# Patient Record
Sex: Female | Born: 2000 | Race: White | Hispanic: No | Marital: Single | Smoking: Never smoker
Health system: Southern US, Community
[De-identification: ages and names within clinical notes are randomized; demographics above are authoritative.]

## PROBLEM LIST (undated history)

## (undated) DIAGNOSIS — F419 Anxiety disorder, unspecified: Secondary | ICD-10-CM

## (undated) DIAGNOSIS — F32A Depression, unspecified: Secondary | ICD-10-CM

## (undated) DIAGNOSIS — F909 Attention-deficit hyperactivity disorder, unspecified type: Secondary | ICD-10-CM

## (undated) DIAGNOSIS — F329 Major depressive disorder, single episode, unspecified: Secondary | ICD-10-CM

## (undated) DIAGNOSIS — F913 Oppositional defiant disorder: Secondary | ICD-10-CM

## (undated) HISTORY — DX: Oppositional defiant disorder: F91.3

## (undated) HISTORY — DX: Attention-deficit hyperactivity disorder, unspecified type: F90.9

---
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
## 2020-04-18 ENCOUNTER — Encounter: Payer: Self-pay | Admitting: Emergency Medicine

## 2020-04-18 ENCOUNTER — Ambulatory Visit
Admission: EM | Admit: 2020-04-18 | Discharge: 2020-04-18 | Disposition: A | Discharge status: Home / Self Care | Payer: Medicaid Other | Attending: Family Medicine | Admitting: Family Medicine

## 2020-04-18 ENCOUNTER — Other Ambulatory Visit: Payer: Self-pay

## 2020-04-18 DIAGNOSIS — N946 Dysmenorrhea, unspecified: Secondary | ICD-10-CM | POA: Diagnosis not present

## 2020-04-18 MED ORDER — ONDANSETRON 4 MG PO TBDP: 4.0000 mg | ORAL_TABLET | Freq: Three times a day (TID) | ORAL | 0 refills | Status: DC | PRN

## 2020-04-18 MED ORDER — NAPROXEN 500 MG PO TABS: 500.0000 mg | ORAL_TABLET | Freq: Two times a day (BID) | ORAL | 0 refills | Status: DC

## 2020-04-18 NOTE — ED Triage Notes (Signed)
Reports menstrual cramping more painful today.  Lower back, abdominal and headache pain.  Periods are not usually this painful

## 2020-04-20 NOTE — ED Provider Notes (Signed)
[facility]   [id number] 04/18/20 Arrival Time: 1548  ASSESSMENT & PLAN:  1. Severe menstrual cramps     Benign abdominal exam. No indications for urgent abdominal/pelvic imaging at this time. Discussed.  Meds ordered this encounter  Medications  . naproxen (NAPROSYN) 500 MG tablet    Sig: Take 1 tablet (500 mg total) by mouth 2 (two) times daily with a meal.    Dispense:  20 tablet    Refill:  0  . ondansetron (ZOFRAN-ODT) 4 MG disintegrating tablet    Sig: Take 1 tablet (4 mg total) by mouth every 8 (eight) hours as needed for nausea or vomiting.    Dispense:  15 tablet    Refill:  0    Follow-up Information    Meccariello, Solmon Ice, DO.   Specialty: Family Medicine Why: As needed. Contact information: [address] [place] [address] [phone]               Reviewed expectations re: course of current medical issues. Questions answered. Outlined signs and symptoms indicating need for more acute intervention. Patient verbalized understanding. After Visit Summary given.   SUBJECTIVE: History from: patient. Michaela Jennings is a 20 y.o. female who presents with complaint of "intense period cramping"; abrupt onset yesterday; same today. Regular menstrual periods "but usually not this painful". Afebrile. No OTC tx. Normal PO intake without n/v/d. Patient's last menstrual period was 04/18/2020.  History reviewed. No pertinent surgical history.   OBJECTIVE:  Vitals:   04/18/20 1557  BP: 115/81  Pulse: 72  Resp: 18  Temp: 97.9 F (36.6 C)  TempSrc: Oral  SpO2: 99%    General appearance: alert, oriented, no acute distress HEENT: [place]; AT; oropharynx moist Lungs: unlabored respirations Abdomen: soft; without distention; mild  and poorly localized tenderness to palpation over lower abdomen; mid; normal bowel sounds; without masses or organomegaly; without guarding or rebound tenderness Back: without reported CVA tenderness; FROM at  waist Extremities: without LE edema; symmetrical; without gross deformities Skin: warm and dry Neurologic: normal gait Psychological: alert and cooperative; normal mood and affect    No Known Allergies                                             Past Medical History:  Diagnosis Date  . Anxiety   . Depression     Social History   Socioeconomic History  . Marital status: Single    Spouse name: Not on file  . Number of children: Not on file  . Years of education: Not on file  . Highest education level: Not on file  Occupational History  . Not on file  Tobacco Use  . Smoking status: Never Smoker  . Smokeless tobacco: Current User  Vaping Use  . Vaping Use: Every day  Substance and Sexual Activity  . Alcohol use: Yes  . Drug use: Yes    Types: Marijuana  . Sexual activity: Not Currently    Birth control/protection: None    Comment: not sexually active x 1 month  Other Topics Concern  . Not on file  Social History Narrative   Lives with mother.  Father and her mother recently separated.  Mother is pregnant with baby girl, due August 16.   Social Determinants of Health   Financial Resource Strain: Not on file  Food Insecurity: Not on file  Transportation Needs: Not on file  Physical Activity: Not on file  Stress: Not on file  Social Connections: Not on file  Intimate Partner Violence: Not on file    Family History  Problem Relation Age of Onset  . Asthma Mother   . Anxiety disorder Mother   . Depression Mother      [name], MD 04/20/20 [id number]

## 2020-04-30 ENCOUNTER — Other Ambulatory Visit: Payer: Self-pay

## 2020-04-30 ENCOUNTER — Ambulatory Visit
Admission: EM | Admit: 2020-04-30 | Discharge: 2020-04-30 | Disposition: A | Discharge status: Home / Self Care | Payer: Medicaid Other | Attending: Emergency Medicine | Admitting: Emergency Medicine

## 2020-04-30 DIAGNOSIS — J069 Acute upper respiratory infection, unspecified: Secondary | ICD-10-CM

## 2020-04-30 DIAGNOSIS — M545 Low back pain, unspecified: Secondary | ICD-10-CM

## 2020-04-30 MED ORDER — IBUPROFEN 800 MG PO TABS: 800.0000 mg | ORAL_TABLET | Freq: Three times a day (TID) | ORAL | 0 refills | Status: DC

## 2020-04-30 MED ORDER — FLUTICASONE PROPIONATE 50 MCG/ACT NA SUSP
1.0000 | Freq: Every day | NASAL | 0 refills | Status: DC
Start: 2020-04-30 — End: 2021-02-01

## 2020-04-30 MED ORDER — CETIRIZINE HCL 10 MG PO CAPS: 10.0000 mg | ORAL_CAPSULE | Freq: Every day | ORAL | 0 refills | Status: DC

## 2020-04-30 NOTE — ED Triage Notes (Signed)
Pt presents with runny nose and scratchy throat that began yesterday, also states while on the way here she developed sharp right side back pain

## 2020-04-30 NOTE — ED Provider Notes (Signed)
[facility]    CSN: [id number] Arrival date & time: 04/30/20  1111      History   Chief Complaint Chief Complaint  Patient presents with  . Nasal Congestion  . Sore Throat  . Back Pain    HPI Michaela Jennings is a 20 y.o. female presenting today for evaluation of URI symptoms.  Reports sinus congestion rhinorrhea and drainage with throat irritation beginning yesterday.  Has felt slightly warm, but denies any known fevers.  On the way to the urgent care she developed a discomfort in her right lower back which is since improved.  Denies urinary symptoms.  HPI  Past Medical History:  Diagnosis Date  . Anxiety   . Depression     Patient Active Problem List   Diagnosis Date Noted  . Depression with anxiety 05/08/2018  . Encounter for initial prescription of contraceptive pills 01/02/2017  . Rash and nonspecific skin eruption 06/22/2013  . Oppositional defiant disorder 05/22/2010  . ATTENTION DEFICIT DISORDER 12/18/2009  . DISRUPTIVE BEHAVIOR DISORDER 01/15/2009  . REFRACTIVE AMBLYOPIA 07/26/2007    History reviewed. No pertinent surgical history.  OB History   No obstetric history on file.      Home Medications    Prior to Admission medications   Medication Sig Start Date End Date Taking? Authorizing Provider  Cetirizine HCl 10 MG CAPS Take 1 capsule (10 mg total) by mouth daily for 10 days. 04/30/20 05/10/20 Yes Zalyn Amend C, PA-C  fluticasone (FLONASE) 50 MCG/ACT nasal spray Place 1-2 sprays into both nostrils daily. 04/30/20  Yes Kamyah Wilhelmsen C, PA-C  ibuprofen (ADVIL) 800 MG tablet Take 1 tablet (800 mg total) by mouth 3 (three) times daily. 04/30/20  Yes Kentrell Guettler C, PA-C  ondansetron (ZOFRAN-ODT) 4 MG disintegrating tablet Take 1 tablet (4 mg total) by mouth every 8 (eight) hours as needed for nausea or vomiting. 04/18/20   [name], MD  levocetirizine (XYZAL) 5 MG tablet Take 1 tablet (5 mg total) by mouth every evening. Patient not  taking: Reported on 03/17/2019 12/16/18 04/18/20  Wallis Bamberg, PA-C  sertraline (ZOLOFT) 25 MG tablet Take 3 tablets (75 mg total) by mouth daily. Patient not taking: Reported on 03/17/2019 07/09/18 04/18/20  Shon Hale, MD  SPRINTEC 28 0.25-35 MG-MCG tablet TAKE 1 TABLET BY MOUTH EVERY DAY Patient not taking: No sig reported 03/05/18 04/18/20  Howard Pouch, MD    Family History Family History  Problem Relation Age of Onset  . Asthma Mother   . Anxiety disorder Mother   . Depression Mother     Social History Social History   Tobacco Use  . Smoking status: Never Smoker  . Smokeless tobacco: Current User  Vaping Use  . Vaping Use: Every day  Substance Use Topics  . Alcohol use: Yes  . Drug use: Yes    Types: Marijuana     Allergies   Patient has no known allergies.   Review of Systems Review of Systems  Constitutional: Negative for activity change, appetite change, chills, fatigue and fever.  HENT: Positive for congestion, rhinorrhea, sinus pressure and sore throat. Negative for ear pain and trouble swallowing.   Eyes: Negative for discharge and redness.  Respiratory: Negative for cough, chest tightness and shortness of breath.   Cardiovascular: Negative for chest pain.  Gastrointestinal: Negative for abdominal pain, diarrhea, nausea and vomiting.  Musculoskeletal: Positive for back pain and myalgias.  Skin: Negative for rash.  Neurological: Negative for dizziness, light-headedness and headaches.  Physical Exam Triage Vital Signs ED Triage Vitals  Enc Vitals Group     BP 04/30/20 1214 111/80     Pulse Rate 04/30/20 1214 74     Resp 04/30/20 1214 18     Temp 04/30/20 1214 98 F (36.7 C)     Temp src --      SpO2 04/30/20 1214 99 %     Weight --      Height --      Head Circumference --      Peak Flow --      Pain Score 04/30/20 1212 7     Pain Loc --      Pain Edu? --      Excl. in GC? --    No data found.  Updated Vital Signs BP 111/80   Pulse  74   Temp 98 F (36.7 C)   Resp 18   LMP 04/18/2020   SpO2 99%   Visual Acuity Right Eye Distance:   Left Eye Distance:   Bilateral Distance:    Right Eye Near:   Left Eye Near:    Bilateral Near:     Physical Exam Vitals and nursing note reviewed.  Constitutional:      Appearance: She is well-developed.     Comments: No acute distress  HENT:     Head: Normocephalic and atraumatic.     Ears:     Comments: Bilateral ears without tenderness to palpation of external auricle, tragus and mastoid, EAC's without erythema or swelling, TM's with good bony landmarks and cone of light. Non erythematous.     Nose: Nose normal.     Mouth/Throat:     Comments: Oral mucosa pink and moist, no tonsillar enlargement or exudate. Posterior pharynx patent and nonerythematous, no uvula deviation or swelling. Normal phonation. Eyes:     Conjunctiva/sclera: Conjunctivae normal.  Cardiovascular:     Rate and Rhythm: Normal rate.  Pulmonary:     Effort: Pulmonary effort is normal. No respiratory distress.     Comments: Breathing comfortably at rest, CTABL, no wheezing, rales or other adventitious sounds auscultated Abdominal:     General: There is no distension.  Musculoskeletal:        General: Normal range of motion.     Cervical back: Neck supple.  Skin:    General: Skin is warm and dry.  Neurological:     Mental Status: She is alert and oriented to person, place, and time.      UC Treatments / Results  Labs (all labs ordered are listed, but only abnormal results are displayed) Labs Reviewed  NOVEL CORONAVIRUS, NAA    EKG   Radiology No results found.  Procedures Procedures (including critical care time)  Medications Ordered in UC Medications - No data to display  Initial Impression / Assessment and Plan / UC Course  I have reviewed the triage vital signs and the nursing notes.  Pertinent labs & imaging results that were available during my care of the patient were  reviewed by me and considered in my medical decision making (see chart for details).     Viral URI with cough versus allergic rhinitis -Covid test pending, exam reassuring, lungs clear to auscultation, suspect viral etiology and recommend symptomatic and supportive care at this time.  Recommendations provided.  Rest and fluids.  Continue to monitor.  Back pain-brief episode of pain, likely MSK etiology, low suspicion of underlying kidney/UTI problem.  Treating with anti-inflammatories and monitor.  Discussed  strict return precautions. Patient verbalized understanding and is agreeable with plan.   Final Clinical Impressions(s) / UC Diagnoses   Final diagnoses:  Viral URI with cough  Acute right-sided low back pain without sciatica     Discharge Instructions     Zyrtec and Flonase daily for congestion and drainage If still feeling congested may supplement over-the-counter Mucinex or Sudafed Tylenol and ibuprofen for throat pain, back pain Follow-up if not improving or worsening    ED Prescriptions    Medication Sig Dispense Auth. Provider   ibuprofen (ADVIL) 800 MG tablet Take 1 tablet (800 mg total) by mouth 3 (three) times daily. 21 tablet Baruc Tugwell C, PA-C   fluticasone (FLONASE) 50 MCG/ACT nasal spray Place 1-2 sprays into both nostrils daily. 16 g Dotti Busey C, PA-C   Cetirizine HCl 10 MG CAPS Take 1 capsule (10 mg total) by mouth daily for 10 days. 10 capsule Chayil Gantt, [place] C, PA-C     PDMP not reviewed this encounter.   Lew Dawes, [place] 04/30/20 1258

## 2020-04-30 NOTE — Discharge Instructions (Signed)
Zyrtec and Flonase daily for congestion and drainage If still feeling congested may supplement over-the-counter Mucinex or Sudafed Tylenol and ibuprofen for throat pain, back pain Follow-up if not improving or worsening

## 2020-05-01 LAB — NOVEL CORONAVIRUS, NAA: SARS-CoV-2, NAA: NOT DETECTED

## 2020-05-01 LAB — SARS-COV-2, NAA 2 DAY TAT

## 2020-07-21 DIAGNOSIS — F419 Anxiety disorder, unspecified: Secondary | ICD-10-CM | POA: Diagnosis not present
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
## 2020-08-07 DIAGNOSIS — F419 Anxiety disorder, unspecified: Secondary | ICD-10-CM | POA: Diagnosis not present

## 2020-08-24 ENCOUNTER — Ambulatory Visit: Payer: Medicaid Other

## 2020-08-31 ENCOUNTER — Encounter: Payer: Self-pay | Admitting: Family Medicine

## 2020-08-31 ENCOUNTER — Ambulatory Visit (INDEPENDENT_AMBULATORY_CARE_PROVIDER_SITE_OTHER): Payer: Medicaid Other | Admitting: Family Medicine

## 2020-08-31 ENCOUNTER — Other Ambulatory Visit: Payer: Self-pay

## 2020-08-31 VITALS — BP 115/80 | HR 62 | Ht 65.0 in | Wt 140.0 lb

## 2020-08-31 DIAGNOSIS — F418 Other specified anxiety disorders: Secondary | ICD-10-CM

## 2020-08-31 NOTE — Assessment & Plan Note (Signed)
-  PHQ-9 score of 22 with 3 for question 9, GAD-7 score of 18 -reassuring that patient is not currently actively suicidal  -held off on medication initiation -list of psychiatrists provided along with community care referral for further assistance and suicide hotline provided  -reassurance provided along with techniques discussed  -fu in 1-2 weeks for mood check and plan to do MDQ and consider starting SSRI

## 2020-08-31 NOTE — Patient Instructions (Addendum)
It was great seeing you today!  Today we discussed your mood. I have provided a list of psychiatrists below, please find one at your convenience and establish care.  Please follow up at your next scheduled appointment in 2 weeks, if anything arises between now and then, please don't hesitate to contact our office.   Thank you for allowing [place] to be a part of your medical care!  Thank you, Dr. Robyne Peers   Psychiatry Resource List (Adults and Children) Most of these providers will take Medicaid. please consult your insurance for a complete and updated list of available providers. When calling to make an appointment have your insurance information available to confirm you are covered.   [facility]:Psychiatry and Counseling [address] [place], [place] [address] [phone]  [facility]  [address] [place], [place] [phone] Crisis [phone]   [facility]:   [facility]: [address]     [phone]   [facility]: [address] [place]. [place],        [phone] [place]: [address] [phone],    [phone] [place]: [phone] [address],                   [phone] Children: [facility] [address]         [phone]  [facility] (virtual only) [phone]    [facility]  (Psychiatry only; Adults /children 12 and over, will take Medicaid)  [address] Laurell Josephs [address], [place], [place] [address]       [phone]   [facility] (Psychiatry & counseling ; adults & children ; will take Medicaid [address]  [place] [address]  Go on-line to complete referral ( [email or web address]/[email or web address] [phone]    (Spanish speaking therapists)  [facility]  Psychiatry & counseling; Adults and children;  Call Registration prior to scheduling an appointment  [phone] [address]    [place], [place] [address]    [phone]  [facility] (Psychiatry & counseling; adults & children; Medicare no Medicaid)  [address]   [place], [place]  [address]      [phone]    [facility] (up to age 49)  Psychiatry & counseling ,will take Medicaid, must do counseling to receive psychiatry services  [address] [place] [address]        [phone]  [facility] (Psychiatry & counseling; adults & children; will take Medicaid) Will need a referral from provider [address],  [place], [place]  [phone]   [facility] --- Walk-In Mon-Friday 8am-3pm ( will take Medicaid, Psychiatry, Adults & children,  [address] [place], [place]   [phone]   [facility] [place], Walk-in M-F 8am-12pm and 1pm -3pm   (Counseling, Psychiatry, will take Medicaid, adults & children)  [address] [place], [place]  [phone]        If you are feeling suicidal or depression symptoms worsen please immediately go to:   If you are thinking about harming yourself or having thoughts of suicide, or if you know someone who is, seek help right away. If you are in crisis, make sure you are not left alone.  If someone else is in crisis, make sure he/she/they is not left alone  Call 988 OR 1-800-273-TALK  24  Hour Availability for Walk-IN services  [facility]  [address] [place], [place] [facility] [place] [phone] Crisis [phone]    Other crisis resources:  [facility] [employer] (Domestic Violence, Rape & Victim Assistance [phone]  [facility]    (ONLY from 8am-4pm)    [phone]  [facility] (24/7)   [phone]  [place] National Suicide Hotline   [phone] Len Childs)

## 2020-08-31 NOTE — Progress Notes (Signed)
    SUBJECTIVE:   CHIEF COMPLAINT / HPI:   Patient presents accompanied by both mother and younger sister for mental health concerns. States that her mood often fluctuates. Sleeps at anytime of the day since she does not have a schedule, denies having issues with falling or staying asleep. Denies lack of or changes to appetite. Denies current SI although states that she often feels SI multiple times throughout the week but they spontaneously resolve and she does not have a plan in place. Last time she had a plan in place was over 2-3 years ago although has had intermittent SI during this time. Believes that if she harms herself it is stupid and it is something she would never do. Support system includes her boyfriend, family and few friends. Currently lives with boyfriend whom she feels safe in this relationship but sometimes she gets mad and snaps at him, No known stressors. Dropped out of high school, considering pursuing her GED but still not certain about her future plans. Currently not on any medication but was in the past but weaned herself off of it since she had a hard time keeping up with the daily doses. Open to starting medications if needed. Previously was seeing therapist but had to change multiple times since only one of them were to her liking.    OBJECTIVE:   BP 115/80   Pulse 62   Ht 5\' 5"  (1.651 m)   Wt 140 lb (63.5 kg)   LMP 08/26/2020   SpO2 99%   BMI 23.30 kg/m   General: Patient well-appearing, in no acute distress.  CV: RRR, no murmurs or gallops auscultated Resp: CTAB Neuro: normal gait Psych: mood appropriate, denies SI and plan   ASSESSMENT/PLAN:   Depression with anxiety -PHQ-9 score of 22 with 3 for question 9, GAD-7 score of 18 -reassuring that patient is not currently actively suicidal  -held off on medication initiation -list of psychiatrists provided along with community care referral for further assistance and suicide hotline provided  -reassurance  provided along with techniques discussed  -fu in 1-2 weeks for mood check and plan to do MDQ and consider starting SSRI     Rinnah Peppel 08/28/2020, DO [facility]

## 2020-09-15 ENCOUNTER — Ambulatory Visit (INDEPENDENT_AMBULATORY_CARE_PROVIDER_SITE_OTHER): Payer: Medicaid Other | Admitting: Family Medicine

## 2020-09-15 ENCOUNTER — Encounter: Payer: Self-pay | Admitting: Family Medicine

## 2020-09-15 ENCOUNTER — Other Ambulatory Visit: Payer: Self-pay

## 2020-09-15 VITALS — BP 112/81 | HR 72 | Ht 65.0 in | Wt 144.2 lb

## 2020-09-15 DIAGNOSIS — F319 Bipolar disorder, unspecified: Secondary | ICD-10-CM

## 2020-09-15 MED ORDER — FLUOXETINE HCL 20 MG PO TABS: 20.0000 mg | ORAL_TABLET | Freq: Every day | ORAL | 0 refills | Status: DC

## 2020-09-15 MED ORDER — QUETIAPINE FUMARATE 25 MG PO TABS: 25.0000 mg | ORAL_TABLET | Freq: Every day | ORAL | 0 refills | Status: DC

## 2020-09-15 NOTE — Patient Instructions (Addendum)
It was great seeing you today!  Today we discussed your mood. Please take seroquel 25 mg at bedtime daily, please take ONLY this for the next 3 days. On the fourth night, please also start taking fluoxetine 20 mg daily and you will continue to take these two medications daily after this. I have also placed a referral to psychiatry, please contact our office if you do not hear from them by the end of the week so we can assist with scheduling.   Please follow up at your next scheduled appointment in 1 week, if anything arises between now and then, please don't hesitate to contact our office.   Thank you for allowing [place] to be a part of your medical care!  Thank you, Dr. Robyne Peers     If you are feeling suicidal or depression symptoms worsen please immediately go to:   If you are thinking about harming yourself or having thoughts of suicide, or if you know someone who is, seek help right away. If you are in crisis, make sure you are not left alone.  If someone else is in crisis, make sure he/she/they is not left alone  Call 988 OR 1-800-273-TALK  24 Hour Availability for Walk-IN services  [facility]  [address] [place], [place] [facility] [place] [phone] Crisis [phone]    Other crisis resources:  [facility] [employer] (Domestic Violence, Rape & Victim Assistance [phone]  [facility]    (ONLY from 8am-4pm)    [phone]  [facility] (24/7)   [phone]  [place] National Suicide Hotline   [phone] Len Childs)

## 2020-09-15 NOTE — Assessment & Plan Note (Signed)
-  PHQ-9 score of 23 with 3 for question 9, reviewed and discussed -MDQ reviewed and discussed, concern for bipolar disorder  -reassuringly patient not currently experiencing SI and without plan and intent -started seroquel 25 mg at bedtime daily, instructed to take only seroquel for the first 3 nights, then started fluoxetine 20 mg daily starting on the 4th day, patient agrees -placed referral to psychiatry as patient would likely benefit given possible increase in medication regimen in the future -follow up in 1 week for mood check and given newly prescribed medications

## 2020-09-15 NOTE — Progress Notes (Signed)
    SUBJECTIVE:   CHIEF COMPLAINT / HPI:   Patient presents for mood check after having extensive history of depression. Feels that her mood has improved, expresses that she is in a great mood today since it is her birthday. Recently had a birthday party with all her family present, she shares that she freaked out and ran away from the party where the family was confused as to what was occurring. She feels that she has no filter but mother states that in the moment she can sometimes act on impulse but retrospectively can look back and realize that was did not act favorably in the moment where she seems to be more aware. Support system includes her boyfriend, mother and younger sister (29 years old). Boyfriend is currently out of town which makes her sad because she gets overwhelmed and cries at times. Feels tired often, drained and has no energy or motivation but would like to be more active and do things like go hiking. Denies having racing thoughts. Did not look into the list of therapists provided at last visit as she feels that when she has been to a therapist in the past she did not get much out of it, although mother states that it helped her short-term. Denies SI currently, last SI was Saturday night. States that she never had a plan and continues to not have a plan at this time. When she does get these thoughts, her protective factors include her younger sister because she knows that she needs her and wants to be there for her. Not currently on any medications. Strong family history of bipolar disorder, mother is currently on lithium.   OBJECTIVE:   BP 112/81   Pulse 72   Ht 5\' 5"  (1.651 m)   Wt 144 lb 3.2 oz (65.4 kg)   LMP 08/26/2020   SpO2 100%   BMI 24.00 kg/m   General: Patient well-appearing, in no acute distress. CV: RRR, no murmurs or gallops auscultated Resp: CTAB Neuro: exhibits logical and tangential thinking process, no pressured speech noted Psych: mood appropriate, no SI,  denies intent and plan  ASSESSMENT/PLAN:   Bipolar 1 disorder, depressed (HCC) -PHQ-9 score of 23 with 3 for question 9, reviewed and discussed -MDQ reviewed and discussed, concern for bipolar disorder  -reassuringly patient not currently experiencing SI and without plan and intent -started seroquel 25 mg at bedtime daily, instructed to take only seroquel for the first 3 nights, then started fluoxetine 20 mg daily starting on the 4th day, patient agrees -placed referral to psychiatry as patient would likely benefit given possible increase in medication regimen in the future -follow up in 1 week for mood check and given newly prescribed medications      Michaela Jennings 08/28/2020, DO [facility]

## 2020-09-22 ENCOUNTER — Ambulatory Visit (INDEPENDENT_AMBULATORY_CARE_PROVIDER_SITE_OTHER): Payer: Medicaid Other | Admitting: Family Medicine

## 2020-09-22 ENCOUNTER — Encounter: Payer: Self-pay | Admitting: Family Medicine

## 2020-09-22 ENCOUNTER — Other Ambulatory Visit: Payer: Self-pay

## 2020-09-22 VITALS — BP 123/76 | HR 63 | Ht 65.0 in | Wt 142.2 lb

## 2020-09-22 DIAGNOSIS — F3181 Bipolar II disorder: Secondary | ICD-10-CM

## 2020-09-22 NOTE — Assessment & Plan Note (Signed)
-  PHQ-9 score of 8 with 1 for question 9, reviewed and discussed. -GAD-7 score of 6, reviewed and discussed. -instructed to continue current regimen of seroquel and fluoxetine  -reassurance provided -pending psych referral, CCM referral placed for further assistance in medication management and counseling, instructed to also go to behavioral health for medication management as patient awaits psych follow up  -suicide hotline provided -follow up in 2 weeks for mood check

## 2020-09-22 NOTE — Patient Instructions (Addendum)
It was great seeing you today!  I am so glad that you are doing better. Please continue the current medication regimen.   Please follow up at your next scheduled appointment in 2 weeks, if anything arises between now and then, please don't hesitate to contact our office.   Thank you for allowing [place] to be a part of your medical care!  Thank you, Dr. Robyne Peers      If you are feeling suicidal or depression symptoms worsen please immediately go to:   If you are thinking about harming yourself or having thoughts of suicide, or if you know someone who is, seek help right away. If you are in crisis, make sure you are not left alone.  If someone else is in crisis, make sure he/she/they is not left alone  Call 988 OR 1-800-273-TALK  24 Hour Availability for Walk-IN services  [facility]  [address] [place], [place] [phone] Crisis [phone]    Other crisis resources:  [facility] [employer] (Domestic Violence, Rape & Victim Assistance [phone]  [facility] [employer] [facility]    (ONLY from 8am-4pm)    [phone]  [facility] (24/7)   [phone]  [place] National Suicide Hotline   [phone] Len Childs)

## 2020-09-22 NOTE — Progress Notes (Signed)
    SUBJECTIVE:   CHIEF COMPLAINT / HPI:   Patient presents for follow up accompanied by mother and younger sister. Started on regimen of seroquel and fluoxetine last week, endorsing compliance. States that she feels much better. She is having less episodes of feeling down and feels less stressed. Endorses sleeping better and same appetite which does not seem to bother her. She felt that she had a breakthrough that made her realize that she can do anything in life even if it does seem hard. This gave her the confidence that she can handle things better. Boyfriend is still traveling and she is doing better with this and able to take care of their pets on her own. Recently got a kitten and is taking care of this to. Feels more motivated. Notes that she has gotten less irritated where small things do not irritate her as much. When asked, mother agrees with this and states that she has been in better spirits and less irritated overall. Patient reports that she no longer feels hopeless.    OBJECTIVE:   BP 123/76   Pulse 63   Ht 5\' 5"  (1.651 m)   Wt 142 lb 3.2 oz (64.5 kg)   LMP 09/18/2020   SpO2 100%   BMI 23.66 kg/m   General: Patient well-appearing, in no acute distress. CV: RRR, no murmurs or gallops auscultated Resp: CTAB, no evidence of respiratory distress Neuro: normal gait Psych: mood appropriate, denies SI or HI, denies plan or intent   ASSESSMENT/PLAN:   Bipolar 2 disorder, major depressive episode (HCC) -PHQ-9 score of 8 with 1 for question 9, reviewed and discussed. -GAD-7 score of 6, reviewed and discussed. -instructed to continue current regimen of seroquel and fluoxetine  -reassurance provided -pending psych referral, CCM referral placed for further assistance in medication management and counseling, instructed to also go to behavioral health for medication management as patient awaits psych follow up  -suicide hotline provided -follow up in 2 weeks for mood check     Crosby Bevan 11/18/2020, DO [facility]

## 2020-10-01 ENCOUNTER — Other Ambulatory Visit: Payer: Self-pay | Admitting: Family Medicine

## 2020-10-01 DIAGNOSIS — F319 Bipolar disorder, unspecified: Secondary | ICD-10-CM

## 2020-10-02 ENCOUNTER — Other Ambulatory Visit: Payer: Self-pay

## 2020-10-05 ENCOUNTER — Ambulatory Visit: Payer: Self-pay

## 2020-10-05 ENCOUNTER — Ambulatory Visit: Payer: Medicaid Other | Admitting: Family Medicine

## 2020-10-05 ENCOUNTER — Telehealth: Payer: Self-pay | Admitting: Licensed Clinical Social Worker

## 2020-10-05 NOTE — Patient Outreach (Signed)
  [facility]) Care Management  [facility] Social Work  10/05/2020  Michaela Jennings 03-17-2000 [id number]  Encounter Medications:  Outpatient Encounter Medications as of 10/05/2020  Medication Sig   Cetirizine HCl 10 MG CAPS Take 1 capsule (10 mg total) by mouth daily for 10 days.   FLUoxetine (PROZAC) 20 MG tablet Take 1 tablet (20 mg total) by mouth daily.   fluticasone (FLONASE) 50 MCG/ACT nasal spray Place 1-2 sprays into both nostrils daily. (Patient not taking: No sig reported)   ibuprofen (ADVIL) 800 MG tablet Take 1 tablet (800 mg total) by mouth 3 (three) times daily. (Patient not taking: No sig reported)   ondansetron (ZOFRAN-ODT) 4 MG disintegrating tablet Take 1 tablet (4 mg total) by mouth every 8 (eight) hours as needed for nausea or vomiting. (Patient not taking: No sig reported)   QUEtiapine (SEROQUEL) 25 MG tablet TAKE 1 TABLET BY MOUTH EVERYDAY AT BEDTIME   [DISCONTINUED] levocetirizine (XYZAL) 5 MG tablet Take 1 tablet (5 mg total) by mouth every evening. (Patient not taking: Reported on 03/17/2019)   [DISCONTINUED] sertraline (ZOLOFT) 25 MG tablet Take 3 tablets (75 mg total) by mouth daily. (Patient not taking: Reported on 03/17/2019)   [DISCONTINUED] SPRINTEC 28 0.25-35 MG-MCG tablet TAKE 1 TABLET BY MOUTH EVERY DAY (Patient not taking: No sig reported)   No facility-administered encounter medications on file as of 10/05/2020.    Functional Status:  No flowsheet data found.  Fall/Depression Screening:  PHQ 2/9 Scores 09/22/2020 09/15/2020 08/31/2020 05/08/2018  PHQ - 2 Score 2 6 6 4   PHQ- 9 Score 8 23 22 17     Assessment:  Care Plan There are no care plans that you recently modified to display for this patient.    Goals Addressed   None    LCSW completed [facility] outreach attempt today but was unable to reach patient successfully. A HIPPA compliant voice message was unable to be left encouraging patient to return call once available. LCSW will ask Scheduling  Care Guide to reschedule [facility] SW appointment with patient as well.  Plan:  Follow-up:  Follow-up in 79 day(s)  [facility], BSW, MSW, LCSW Managed Medicaid LCSW [facility]  [facility] [email or web address] Phone: [phone]

## 2020-10-05 NOTE — Patient Instructions (Signed)
Michaela Jennings ,   The [facility] Managed Care Team is available to provide assistance to you with your healthcare needs at no cost and as a benefit of your [facility] Health plan. I'm sorry I was unable to reach you today for our scheduled appointment. Our care guide will call you to reschedule our telephone appointment. Please call me at the number below. I am available to be of assistance to you regarding your healthcare needs. .   Thank you,   Dickie La, BSW, MSW, LCSW Managed Medicaid LCSW [facility]  [email or web address] Phone: [phone]

## 2020-10-06 ENCOUNTER — Telehealth: Payer: Self-pay | Admitting: Family Medicine

## 2020-10-06 NOTE — Telephone Encounter (Signed)
..   Medicaid Managed Care   Unsuccessful Outreach Note  10/06/2020 Name: Michaela Jennings MRN: [id number] DOB: 2000/09/16  Referred by: Maury Dus, MD Reason for referral : High Risk Managed Medicaid (I called the patient today to reschedule her phone visit with the MM LCSW. Her VM was full and I was not able to leave a message.)   An unsuccessful telephone outreach was attempted today. The patient was referred to the case management team for assistance with care management and care coordination.   Follow Up Plan: The care management team will reach out to the patient again over the next 7 days.   Weston Settle Care Guide, High Risk Medicaid Managed Care Embedded Care Coordination [facility]  [facility]

## 2020-10-12 ENCOUNTER — Other Ambulatory Visit: Payer: Self-pay | Admitting: Family Medicine

## 2020-10-12 DIAGNOSIS — F319 Bipolar disorder, unspecified: Secondary | ICD-10-CM

## 2020-10-16 ENCOUNTER — Telehealth: Payer: Self-pay | Admitting: Family Medicine

## 2020-10-16 NOTE — Telephone Encounter (Signed)
..   Medicaid Managed Care   Unsuccessful Outreach Note  10/16/2020 Name: Michaela Jennings MRN: [id number] DOB: 08/16/2000  Referred by: Maury Dus, MD Reason for referral : High Risk Managed Medicaid (I called this patient today to get her phone visits with the MM Pharmacist and the MM LCSW rescheduled. She did not answer and her VM was full.)   A second unsuccessful telephone outreach was attempted today. The patient was referred to the case management team for assistance with care management and care coordination.   Follow Up Plan: The care management team will reach out to the patient again over the next 7 days.   Weston Settle Care Guide, High Risk Medicaid Managed Care Embedded Care Coordination [facility]  [facility]

## 2020-10-20 ENCOUNTER — Ambulatory Visit: Payer: Self-pay

## 2020-10-26 ENCOUNTER — Telehealth: Payer: Self-pay | Admitting: Family Medicine

## 2020-10-26 ENCOUNTER — Other Ambulatory Visit: Payer: Self-pay | Admitting: Family Medicine

## 2020-10-26 DIAGNOSIS — F319 Bipolar disorder, unspecified: Secondary | ICD-10-CM

## 2020-10-26 NOTE — Telephone Encounter (Signed)
..   Medicaid Managed Care   Unsuccessful Outreach Note  10/26/2020 Name: Michaela Jennings MRN: [id number] DOB: [birth year]  Referred by: Maury Dus, MD Reason for referral : High Risk Managed Medicaid (I called this patient to get her phone visit with the MM LCSW rescheduled. I was not able to leave a message. This was the third attempt.)   Third unsuccessful telephone outreach was attempted today. The patient was referred to the case management team for assistance with care management and care coordination. The patient's primary care provider has been notified of our unsuccessful attempts to make or maintain contact with the patient. The care management team is pleased to engage with this patient at any time in the future should he/she be interested in assistance from the care management team.   Follow Up Plan: We have been unable to make contact with the patient for follow up. The care management team is available to follow up with the patient after provider conversation with the patient regarding recommendation for care management engagement and subsequent re-referral to the care management team.   Weston Settle Care Guide, High Risk Medicaid Managed Care Embedded Care Coordination [facility]  [facility]

## 2020-10-30 ENCOUNTER — Other Ambulatory Visit: Payer: Self-pay

## 2020-10-30 ENCOUNTER — Ambulatory Visit (INDEPENDENT_AMBULATORY_CARE_PROVIDER_SITE_OTHER): Payer: Medicaid Other | Admitting: Family Medicine

## 2020-10-30 VITALS — BP 110/76 | HR 76 | Ht 65.0 in | Wt 137.6 lb

## 2020-10-30 DIAGNOSIS — F3181 Bipolar II disorder: Secondary | ICD-10-CM | POA: Diagnosis present

## 2020-10-30 MED ORDER — QUETIAPINE FUMARATE 50 MG PO TABS: 50.0000 mg | ORAL_TABLET | Freq: Every day | ORAL | 1 refills | Status: DC

## 2020-10-30 NOTE — Patient Instructions (Addendum)
It was great to meet you!  Things we discussed at today's visit: - We are going to increase your Quetiapine (Seroquel) to 50mg  daily. I have sent a refill to your pharmacy. - We are going to stop your Fluoxetine (Prozac). You can take the last dose tonight and then stop it altogether. It's ok to do this without a taper. - The most important part of your care is getting in with psychiatry.  Please go to:  [facility] [address]  [place], [place] [phone]  Urgent psychiatry (medication management) Monday-Wednesday 8-11AM.   It is highly recommended that you show up at 7/730 because it is first come first serve.    Take care and seek immediate care sooner if you develop any concerns.  Dr. 02-24-1990 Family Medicine

## 2020-10-30 NOTE — Assessment & Plan Note (Addendum)
Patient has history of mood disorder with manic and depressive episodes. Concern with marijuana use, and counseled on cessation. Concern for SSRI use with mood disorder, we will stop fluoxetine. Not actively manic or depressed. Symptoms improve with quetiapine treatment and we will increase dose from 25 mg to 50 mg. No concern for suicide/homicide at this time (refer to HPI). Need to see psych as her mental health diagnoses are complicated.  -Discontinue fluoxetine -Take quetiapine 50 mg once per day -F/U in 2 months -Psych referral

## 2020-10-30 NOTE — Progress Notes (Addendum)
SUBJECTIVE:  Michaela Jennings is a 20 y.o. female with history notable for ADHD, mood disorder, major depressive disorder presenting for management of mood disorder and depressive symptoms.   CHIEF COMPLAINT: Refills HPI:  Mood disorder  Major Depressive Disorder Patient presents to the office with her boyfriend Alycia Rossetti). She lives with her boyfriend.Patient was last seen in August for manic symptoms and depressive symptoms. She was started on quetiapine 25 mg and given referral for psych.  She is also taking fluoxetine 20 mg once per day. She reports improvement in all manic and depressive symptoms with quetiapine 25 mg, but has been without her medication for 2 weeks. Without the quetiapine she experiences crying, outbursts, lack of sleep, and difficulty completing ADLs. She currently experiences daily feelings of not wanting to be alive. She denies intention of harm. She has a history of self harm (cutting), but no suicide attempt. She denies having a plan or access to weapons at home. She said her support network including BF, pets, and family give her a reason to live. When having suicidal thoughts she reminds herself that these feelings are not permanent. All suicidal thoughts were improved when taking her quetiapine. She has a german sheperd named rickett that she enjoys walking.  Psych referral: Has not yet heard from psych.  Therapy: Was seeing therapist a year ago, and she did not return. She had transportation issues. Not interested in seeing therapy at this time.   Social: Vaping with nicotine, smoking marijuana, no other illicit drug use. Not drinking alcohol.   ROS: As listed above. All other ROS negative.   PERTINENT  PMH / PSH/Family/Social History : reviewed and updated.  OBJECTIVE:   BP 110/76   Pulse 76   Ht 5\' 5"  (1.651 m)   Wt 137 lb 9.6 oz (62.4 kg)   LMP 10/18/2020   SpO2 97%   BMI 22.90 kg/m   Today's weight:  Last Weight  Most recent update: 10/30/2020  1:45  PM    Weight  62.4 kg (137 lb 9.6 oz)            Review of prior weights: Filed Weights   10/30/20 1344  Weight: 137 lb 9.6 oz (62.4 kg)    Physical Exam Constitutional:      Appearance: Normal appearance.  HENT:     Head: Normocephalic and atraumatic.  Cardiovascular:     Rate and Rhythm: Normal rate and regular rhythm.  Pulmonary:     Effort: Pulmonary effort is normal.     Breath sounds: Normal breath sounds.  Neurological:     Mental Status: She is alert.  Psychiatric:        Attention and Perception: Attention and perception normal.        Behavior: Behavior is hyperactive. Behavior is cooperative.        Cognition and Memory: Cognition and memory normal.        Judgment: Judgment normal.     ASSESSMENT/PLAN:   Bipolar 2 disorder, major depressive episode (HCC) Patient has history of mood disorder with manic and depressive episodes. Concern with marijuana use, and counseled on cessation. Concern for SSRI use with mood disorder, we will stop fluoxetine. Not actively manic or depressed. Symptoms improve with quetiapine treatment and we will increase dose from 25 mg to 50 mg. No concern for suicide/homicide at this time (refer to HPI). Need to see psych as her mental health diagnoses are complicated.  -Discontinue fluoxetine -Take quetiapine 50 mg once per  day -F/U in 2 months -Psych referral     Tiffany Kocher OMS IV Family Medicine Teaching Service  [facility]   I was personally present and re-performed the exam and medical decision making and verified the service and findings are accurately documented in the student's note.  Maury Dus, MD 11/03/2020 1:30 PM

## 2021-01-26 ENCOUNTER — Ambulatory Visit (INDEPENDENT_AMBULATORY_CARE_PROVIDER_SITE_OTHER): Payer: Medicaid Other | Admitting: Clinical

## 2021-01-26 ENCOUNTER — Other Ambulatory Visit: Payer: Self-pay

## 2021-01-26 ENCOUNTER — Ambulatory Visit (INDEPENDENT_AMBULATORY_CARE_PROVIDER_SITE_OTHER): Payer: Medicaid Other | Admitting: Physician Assistant

## 2021-01-26 ENCOUNTER — Encounter (HOSPITAL_COMMUNITY): Payer: Self-pay | Admitting: Physician Assistant

## 2021-01-26 DIAGNOSIS — F3181 Bipolar II disorder: Secondary | ICD-10-CM | POA: Diagnosis not present

## 2021-01-26 MED ORDER — QUETIAPINE FUMARATE 50 MG PO TABS: 50.0000 mg | ORAL_TABLET | Freq: Every day | ORAL | 1 refills | Status: AC
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
## 2021-01-26 NOTE — Progress Notes (Signed)
Comprehensive Clinical Assessment (CCA) Note  01/26/2021 Michaela Jennings [id number]  Chief Complaint:  Chief Complaint  Patient presents with   Depression   Anxiety   Visit Diagnosis:  Bipolar 2 disorder, major depressive episode  Interpretive summary: Client is a 20 year old female presenting to the [facility] for outpatient services.  Client reported she is referred by her [facility] primary care physician for clinical assessment.  Client presents with a history of major depression, anxiety, ADHD, and ODD.  Client reported since August 2022 her primary care physician has been treating her depression and anxiety with quetiapine.  Client reported she has felt stable on the medication.  Client reported since approximately age 62 she was treated for ADHD and ODD.  Client reported she started antidepressants when she was approximately 20 years old.  Client reported she only took them for a few months and then stopped.  Client reported over time her depression has been quite moments of rage which include yelling, feelings of hopelessness "but nothing matters", feeling worried all the time, unexplained crying spells, and anxiety which makes it difficult for her to breathe.  Client reported in the past she had suicidal ideations but without plan and/or attempt.  Client reported no history of hospitalization for mental health reasons.  Client reported daily marijuana use. Client presented to the appointment oriented x5, appropriately dressed, and friendly.  Client denied hallucinations, delusions, suicidal and homicidal ideations.  Client was screened for pain, nutrition, [place] suicide severity and the following S DOH:  GAD 7 : Generalized Anxiety Score 01/26/2021 09/22/2020 08/31/2020  Nervous, Anxious, on Edge 3 1 3   Control/stop worrying 0 1 3  Worry too much - different things 0 1 3  Trouble relaxing 1 1 1   Restless 1 0 2  Easily annoyed or irritable 3 1 3   Afraid -  awful might happen 1 1 3   Total GAD 7 Score 9 6 18   Anxiety Difficulty Somewhat difficult Somewhat difficult Very difficult     Flowsheet Row Counselor from 01/26/2021 in [facility]  PHQ-9 Total Score 12       Treatment recommendations: Psych evaluation with medication management.  Client declined therapy at this time. Therapist provided information on format of appointment (virtual or face to face).   The client was advised to call back or seek an in-person evaluation if the symptoms worsen or if the condition fails to improve as anticipated before the next scheduled appointment. Client was in agreement with treatment recommendations.    CCA Biopsychosocial Intake/Chief Complaint:  Client reported reoccuring history of depression and anxiety. Client reported she has been on psychiatric medication management since the age of 33 for ADHD and ODD.  Current Symptoms/Problems: Client reported depressed mood, feelings of rage, feeling hopeless, feeling worried, feeling on edge, and shortness of breath  Patient Reported Schizophrenia/Schizoaffective Diagnosis in Past: No  Strengths: Family support  Type of Services Patient Feels are Needed: Psychiatry  Initial Clinical Notes/Concerns: No data recorded  Mental Health Symptoms Depression:   Change in energy/activity; Difficulty Concentrating; Hopelessness; Tearfulness   Duration of Depressive symptoms:  Greater than two weeks   Mania:   None   Anxiety:    Worrying; Tension   Psychosis:   None   Duration of Psychotic symptoms: No data recorded  Trauma:   None   Obsessions:   None   Compulsions:   None   Inattention:   None   Hyperactivity/Impulsivity:   None  Oppositional/Defiant Behaviors:   None   Emotional Irregularity:   None   Other Mood/Personality Symptoms:  No data recorded   Mental Status Exam Appearance and self-care  Stature:   Average   Weight:   Average  weight   Clothing:   Casual   Grooming:   Normal   Cosmetic use:   Age appropriate   Posture/gait:   Normal   Motor activity:   Not Remarkable   Sensorium  Attention:   Normal   Concentration:   Normal   Orientation:   X5   Recall/memory:   Normal   Affect and Mood  Affect:   Congruent   Mood:   Euthymic   Relating  Eye contact:   Normal   Facial expression:   Responsive   Attitude toward examiner:   Cooperative   Thought and Language  Speech flow:  Clear and Coherent   Thought content:   Appropriate to Mood and Circumstances   Preoccupation:   None   Hallucinations:   None   Organization:  No data recorded  [employer] of Knowledge:   Good   Intelligence:   Average   Abstraction:   Normal   Judgement:   Good   Reality Testing:   Adequate   Insight:   Good   Decision Making:   Normal   Social Functioning  Social Maturity:   Responsible   Social Judgement:   Normal   Stress  Stressors:   Transitions   Coping Ability:   Optician, dispensing Deficits:   Activities of daily living   Supports:   Family; Friends/Service system     Religion: Religion/Spirituality Are You A Religious Person?: No  Leisure/Recreation: Leisure / Recreation Do You Have Hobbies?: Yes  Exercise/Diet: Exercise/Diet Do You Exercise?: No Have You Gained or Lost A Significant Amount of Weight in the Past Six Months?: No Do You Follow a Special Diet?: No Do You Have Any Trouble Sleeping?: Yes   CCA Employment/Education Employment/Work Situation: Employment / Work Situation Employment Situation: Employed Where is Patient Currently Employed?: [employer] How Long has Patient Been Employed?: A few weeks  Education: Education Did Teacher, adult education From [employer]?: No   CCA Family/Childhood History Family and Relationship History: Family history Marital status: Single Does patient have children?: No  Childhood  History:  Childhood History By whom was/is the patient raised?: Mother Additional childhood history information: Client reported she is from [place] and was raised by her mother and mother side of the family.  Client reported she had little to no contact with her biological father during her childhood.  Client reported she had a choice to speak with them but she chose not to.  Client reported when her mother was with her sister's father she was exposed to traumatic experiences of frequently moving in and out of the house because they fought a lot.  Client reported her sister's father intentionally harmed himself and called the police to appointment on her mother. Does patient have siblings?: Yes Number of Siblings: 1 Description of patient's current relationship with siblings: Client reported she has a 74 year old sister whom she has a good relationship with Did patient suffer any verbal/emotional/physical/sexual abuse as a child?: No Did patient suffer from severe childhood neglect?: No Has patient ever been sexually abused/assaulted/raped as an adolescent or adult?: No Was the patient ever a victim of a crime or a disaster?: No Witnessed domestic violence?: Yes Has patient been affected by domestic violence  as an adult?: No  Child/Adolescent Assessment:     CCA Substance Use Alcohol/Drug Use: Alcohol / Drug Use History of alcohol / drug use?: Yes Substance #1 Name of Substance 1: Marijuana 1 - Frequency: Daily-once per day 1 - Last Use / Amount: 01/25/2029 1 - Method of Aquiring: Acquaintances 1- Route of Use: Smoking                       ASAM's:  Six Dimensions of Multidimensional Assessment  Dimension 1:  Acute Intoxication and/or Withdrawal Potential:   Dimension 1:  Description of individual's past and current experiences of substance use and withdrawal: Client reported no history of substance use treatment  Dimension 2:  Biomedical Conditions and  Complications:   Dimension 2:  Description of patient's biomedical conditions and  complications: Client reported no medical conditions caused and/or affected by marijuana use  Dimension 3:  Emotional, Behavioral, or Cognitive Conditions and Complications:  Dimension 3:  Description of emotional, behavioral, or cognitive conditions and complications: Client reported recurrent history of depression and anxiety  Dimension 4:  Readiness to Change:  Dimension 4:  Description of Readiness to Change criteria: Client is in the precontemplation stage of change  Dimension 5:  Relapse, Continued use, or Continued Problem Potential:  Dimension 5:  Relapse, continued use, or continued problem potential critiera description: Client reported she continues to use on a daily basis  Dimension 6:  Recovery/Living Environment:  Dimension 6:  Recovery/Iiving environment criteria description: Client reported she has positive support from family  ASAM Severity Score: ASAM's Severity Rating Score: 5  ASAM Recommended Level of Treatment: ASAM Recommended Level of Treatment: Level I Outpatient Treatment   Substance use Disorder (SUD) Substance Use Disorder (SUD)  Checklist Symptoms of Substance Use: Presence of craving or strong urge to use  Recommendations for Services/Supports/Treatments: Recommendations for Services/Supports/Treatments Recommendations For Services/Supports/Treatments: Medication Management  DSM5 Diagnoses: Patient Active Problem List   Diagnosis Date Noted   Bipolar 2 disorder, major depressive episode (HCC) 09/22/2020   Bipolar 1 disorder, depressed (HCC) 09/15/2020   Depression with anxiety 05/08/2018   Encounter for initial prescription of contraceptive pills 01/02/2017   Rash and nonspecific skin eruption 06/22/2013   Oppositional defiant disorder 05/22/2010   ATTENTION DEFICIT DISORDER 12/18/2009   DISRUPTIVE BEHAVIOR DISORDER 01/15/2009   REFRACTIVE AMBLYOPIA 07/26/2007    Patient  Centered Plan: Patient is on the following Treatment Plan(s):  Depression   Referrals to Alternative Service(s): Referred to Alternative Service(s):   Place:   Date:   Time:    Referred to Alternative Service(s):   Place:   Date:   Time:    Referred to Alternative Service(s):   Place:   Date:   Time:    Referred to Alternative Service(s):   Place:   Date:   Time:     Loree Fee, LCSW

## 2021-02-01 ENCOUNTER — Ambulatory Visit (HOSPITAL_COMMUNITY)
Admission: EM | Admit: 2021-02-01 | Discharge: 2021-02-01 | Disposition: A | Discharge status: Home / Self Care | Payer: Medicaid Other | Attending: Behavioral Health | Admitting: Behavioral Health

## 2021-02-01 DIAGNOSIS — R45851 Suicidal ideations: Secondary | ICD-10-CM | POA: Insufficient documentation

## 2021-02-01 DIAGNOSIS — Z79899 Other long term (current) drug therapy: Secondary | ICD-10-CM | POA: Insufficient documentation

## 2021-02-01 DIAGNOSIS — Z9152 Personal history of nonsuicidal self-harm: Secondary | ICD-10-CM | POA: Insufficient documentation

## 2021-02-01 DIAGNOSIS — F332 Major depressive disorder, recurrent severe without psychotic features: Secondary | ICD-10-CM | POA: Insufficient documentation

## 2021-02-01 DIAGNOSIS — F411 Generalized anxiety disorder: Secondary | ICD-10-CM

## 2021-02-01 MED ORDER — HYDROXYZINE HCL 10 MG PO TABS: 10.0000 mg | ORAL_TABLET | Freq: Four times a day (QID) | ORAL | 0 refills | Status: AC | PRN

## 2021-02-01 NOTE — ED Notes (Signed)
Patient received AVS with follow up instruction about community resources.

## 2021-02-01 NOTE — ED Provider Notes (Signed)
Behavioral Health Urgent Care Medical Screening Exam  Patient Name: Michaela Jennings MRN: [id number] Date of Evaluation: 02/01/21  Diagnosis:  Final diagnoses:  Severe episode of recurrent major depressive disorder, without psychotic features (HCC)  Anxiety state   History of Present illness: Michaela Jennings is a 20 y.o. female who presents this date with ongoing depression and anxiety. Patient is brought in by her mother. Patient states Michaela Jennings is employed by [employer] and had to have her mother pick her up from work after having "a breakdown" at work earlier. Patient states Michaela Jennings was waiting on a customer and "for no reason" started crying.  Michaela Jennings, 20 y.o., female patient seen face to face by this provider, consulted with Dr. Gasper Sells and chart reviewed on 02/01/21.    On evaluation Michaela Jennings is in a sitting position in no acute distress. Michaela Jennings is alert and oriented x 4. Michaela Jennings is calm and cooperative. Her mood is depressed and affect is congruent. Michaela Jennings is speaking in a clear tone at moderate volume, and normal pace; with good eye contact. Her thought process is coherent and relevant. Patient denies HI/AVH. There is no indication that Michaela Jennings is responding to internal or external stimuli.   Michaela Jennings reports having a "breakdown" at work earlier today. Michaela Jennings states that Michaela Jennings was waiting on a customer and for no reason Michaela Jennings began crying. Michaela Jennings reports that Michaela Jennings is a bubbly person and it is unusual for her to not greet or talk to the customers. Michaela Jennings states that this morning Michaela Jennings did not feel like going to work but was able to get herself up and go. Michaela Jennings states that when Michaela Jennings got to work this morning Michaela Jennings did not feel like talking or greeting her customers. Michaela Jennings is unable to identify any triggers or stressors attributing to changes in her mood. Michaela Jennings states that it could be anything triggering her, Michaela Jennings experienced a lot of past trauma. Michaela Jennings does not further elaborate on past trauma experiences.   Michaela Jennings reports  feeling depressed for years and describes her depressive symptoms as passive suicidal thoughts, "feeling like I want to die," sadness, worthlessness, hopelessness, guilt, decreased motivation, irritability, and low self esteem.   Michaela Jennings reports feeling increasingly anxious and describes her symptoms as can't breathe, heart beating fast, and worrying a lot.   Michaela Jennings states that smoking marijuana makes her depression and anxiety better and bad situations and people yelling at her makes her depression and anxiety worse.   Michaela Jennings denies active suicidal thoughts at this time. Michaela Jennings reports having passive thoughts or "wanting to die with no plan or intent for a long time. Michaela Jennings denies a hx of attempting suicide. Michaela Jennings reports that her mother, 10-y.o sister and aunt are her support persons and reasons why Michaela Jennings won't attempt suicide. Michaela Jennings reports a hx of cutting to cope 3 or 4 years ago. Michaela Jennings denies hx of psychiatric inpatient tx. Michaela Jennings has no current therapist. Michaela Jennings states that Michaela Jennings attended therapy in the past with little effect. Patient's mother reports that the patient received therapy starting at age 60 y.o. Patient receives outpatient follow up services here at the [facility] for medication management. Patient is prescribed Seroquel 50 mg po QHS. Patient reports a hx of anxiety and depression. Michaela Jennings is unable to recall a past hx of having a manic episode or being dx with bipolar 1 dx.  Patient resides with her ex-boyfriend. Patient currently works Estate agent. Patient reports drinking alcohol socially. Patient reports smoking marijuana daily, mostly once daily.  Patient's mother  Michaela Jennings, states that the patient's cousin and ex boyfriend left her stranded at an unfamiliar gas station in [place] on Saturday. Michaela Jennings states that the patient drove separately from her cousin and ex boyfriend. Michaela Jennings states that while her daughter was in the store paying for gas they drove off leaving her in [place]. Michaela Jennings states that the patient became really upset and was  shouting.   I discussed with the patient the PHP program or the facility base crisis here at the [facility] for treatment. Theodoro Grist, TTS counselor discussed with the patient outpatient resources such as DBT. Patient verbalizes interest in both outpatient treatment options.   At this time Michaela Jennings is educated and verbalizes understanding of mental health resources and other crisis services in the community. Michaela Jennings is instructed to call 911 and present to the nearest emergency room should Michaela Jennings experience any suicidal/homicidal ideation, auditory/visual/hallucinations, or detrimental worsening of her mental health condition.    Safety Plan Burna Atlas will reach out her mother Michaela Jennings, call 911 or call mobile crisis, or go to nearest emergency room if condition worsens or if suicidal thoughts become active Patients' will follow up with [facility] for outpatient psychiatric services (therapy/PHP/medication management).  The suicide prevention education provided includes the following: Suicide risk factors Suicide prevention and interventions National Suicide Hotline telephone number [facility] assessment telephone number [facility] Emergency Assistance 911 [facility] and/or Residential Mobile Crisis Unit telephone number Request made of family/significant other to:  Leontine Locket weapons (e.g., guns, rifles, knives), all items previously/currently identified as safety concern.   Remove drugs/medications (over the counter, prescriptions, illicit drugs), all items previously/currently identified as a safety concern.    Psychiatric Specialty Exam  Presentation  General Appearance:Appropriate for Environment  Eye Contact:Fair  Speech:Clear and Coherent  Speech Volume:Normal  Handedness:No data recorded  Mood and Affect  Mood:Depressed  Affect:Appropriate   Thought Process  Thought Processes:Coherent; Linear  Descriptions of Associations:Intact  Orientation:Full  (Time, Place and Person)  Thought Content:WDL  Diagnosis of Schizophrenia or Schizoaffective disorder in past: No   Hallucinations:None  Ideas of Reference:None  Suicidal Thoughts:Yes, Passive Without Intent; Without Plan  Homicidal Thoughts:No data recorded  Sensorium  Memory:Immediate Fair; Recent Fair; Remote Fair  Judgment:Fair  Insight:Fair   Executive Functions  Concentration:Fair  Attention Span:Fair  Recall:Fair  Fund of Knowledge:Fair  Language:Fair   Psychomotor Activity  Psychomotor Activity:Normal   Assets  Assets:Communication Skills; Desire for Improvement; Financial Resources/Insurance; Housing; Leisure Time; Physical Health; Social Support; Resilience; Talents/Skills; Transportation   Sleep  Sleep:Fair  Number of hours: No data recorded  No data recorded  Physical Exam: Physical Exam Constitutional:      Appearance: Normal appearance.  HENT:     Head: Normocephalic and atraumatic.     Nose: Nose normal.  Eyes:     Conjunctiva/sclera: Conjunctivae normal.  Cardiovascular:     Rate and Rhythm: Normal rate.  Pulmonary:     Effort: Pulmonary effort is normal.  Musculoskeletal:     Cervical back: Normal range of motion.  Neurological:     Mental Status: Michaela Jennings is alert and oriented to person, place, and time.   Review of Systems  Constitutional: Negative.   HENT: Negative.    Eyes: Negative.   Respiratory: Negative.    Cardiovascular: Negative.   Gastrointestinal: Negative.   Genitourinary: Negative.   Musculoskeletal: Negative.   Skin: Negative.   Neurological: Negative.   Endo/Heme/Allergies: Negative.   Psychiatric/Behavioral:  Positive for depression and suicidal ideas. The patient is  nervous/anxious.   Blood pressure 114/83, pulse (!) 56, temperature 98 F (36.7 C), temperature source Oral, resp. rate 16, SpO2 100 %. There is no height or weight on file to calculate BMI.  Musculoskeletal: Strength & Muscle Tone: within  normal limits Gait & Station: normal Patient leans: N/A   [facility] MSE Discharge Disposition for Follow up and Recommendations: Based on my evaluation the patient does not appear to have an emergency medical condition and can be discharged with resources and follow up care in outpatient services for Medication Management, Partial Hospitalization Program, Individual Therapy, and Group Therapy  Medications:  Continue Seroquel 50 mg po QHS Start Vistaril 10 mg po every 6 hours PRN for anxiety x 30 tablets sent to pharmacy on file.  Per chart review on 09/15/2020, note indicates concerns for bipolar based on MDQ. Patient may require additional screening for Bipolar. Would recommend starting an antidepressant if no hx of bipolar.   -Patient referred to the PHP at the [facility]. A secure chat sent to Milana Na for referral.   -Outpatient counseling is recommended:   [facility] - Offers DBT Therapy recommended by the provider today. Address: [address] [place], [place] [address] Phone: [phone]  [facility] [address] [place], [place] [address] Phone: [phone]   [facility] Address: [address], [place], [place] [address] Phone: [phone] Layla Barter, NP 02/01/2021, 2:12 PM

## 2021-02-01 NOTE — Progress Notes (Signed)
Patient is a 20 year old female that presents this date with ongoing depression and anxiety. Patient is brought in by her mother who is present and renders collateral. Patient states she is employed by [employer] and had to have her mother pick her up from work after having "a breakdown" at work earlier. Patient states she was waiting on a customer and "for no reason" started crying. Patient was seen on 12/20 when she presented with similar symptoms. See note of that date. Patient is reporting passive S/I although denies any plan or intent. Patient denies any H/I or AVH. Patient is prescribed Seroquel by her PCP for symptom management. Patient states she feels her dosage isn't correct and feels other medications and therapy may need to be supplemented. Patient is on 50 mg.

## 2021-02-01 NOTE — Discharge Instructions (Addendum)
Discharge recommendations:  Patient is to take medications as prescribed. Please see information for follow-up appointment with psychiatry and therapy. Please follow up with your primary care provider for all medical related needs.   Therapy: We recommend that patient participate in individual therapy to address mental health concerns.  Medications: The parent/guardian is to contact a medical professional and/or outpatient provider to address any new side effects that develop. Parent/guardian should update outpatient providers of any new medications and/or medication changes.   Atypical antipsychotics: If you are prescribed an atypical antipsychotic, it is recommended that your height, weight, BMI, blood pressure, fasting lipid panel, and fasting blood sugar be monitored by your outpatient providers.  Safety:  The patient should abstain from use of illicit substances/drugs and abuse of any medications. If symptoms worsen or do not continue to improve or if the patient becomes actively suicidal or homicidal then it is recommended that the patient return to the closest hospital emergency department, the [facility], or call 911 for further evaluation and treatment. National Suicide Prevention Lifeline 1-800-SUICIDE or 314 809 7867.  About 988 988 offers 24/7 access to trained crisis counselors who can help people experiencing mental health-related distress. People can call or text 988 or chat 988lifeline.org for themselves or if they are worried about a loved one who may need crisis support.    Outpatient treatment is recommended:   [facility] - Offers DBT Therapy recommended by the provider today. Address: [address] [place], [place] [address] Phone: [phone]  [facility] [address] [place], [place] [address] Phone: [phone]   [facility] Address: [address] [place], [place],  [place] [address] Phone: [phone]

## 2021-02-02 ENCOUNTER — Telehealth (HOSPITAL_COMMUNITY): Payer: Self-pay | Admitting: Professional

## 2021-02-08 ENCOUNTER — Ambulatory Visit
Admission: EM | Admit: 2021-02-08 | Discharge: 2021-02-08 | Disposition: A | Discharge status: Home / Self Care | Payer: Medicaid Other | Attending: Physician Assistant | Admitting: Physician Assistant

## 2021-02-08 ENCOUNTER — Other Ambulatory Visit: Payer: Self-pay

## 2021-02-08 DIAGNOSIS — M545 Low back pain, unspecified: Secondary | ICD-10-CM

## 2021-02-08 LAB — POCT URINALYSIS DIP (MANUAL ENTRY)
Bilirubin, UA: NEGATIVE
Glucose, UA: NEGATIVE mg/dL
Ketones, POC UA: NEGATIVE mg/dL
Leukocytes, UA: NEGATIVE
Nitrite, UA: NEGATIVE
Spec Grav, UA: >=1.03 — AB (ref 1.010–1.025)
Urobilinogen, UA: 0.2 E.U./dL
pH, UA: 6 (ref 5.0–8.0)

## 2021-02-08 LAB — POCT URINE PREGNANCY: Preg Test, Ur: NEGATIVE

## 2021-02-08 MED ORDER — PREDNISONE 20 MG PO TABS: 40.0000 mg | ORAL_TABLET | Freq: Every day | ORAL | 0 refills | Status: AC

## 2021-02-08 MED ORDER — CYCLOBENZAPRINE HCL 10 MG PO TABS: 10.0000 mg | ORAL_TABLET | Freq: Two times a day (BID) | ORAL | 0 refills | Status: AC | PRN

## 2021-02-08 NOTE — ED Provider Notes (Signed)
[facility]    CSN: [id number] Arrival date & time: 02/08/21  [id number]      History   Chief Complaint Chief Complaint  Patient presents with   Back Pain   Abdominal Pain    HPI Michaela Jennings is a 21 y.o. female.   Patient here today for evaluation of sharp lower back pain that started yesterday. Back pain became more severe at one point this morning, movement made pain worse, states muscles felt tight. She has had some associated abdominal pain but assumed this was due to cramping due to current menstrual period. She denies any known injury to her back. She has had some nausea when she is experiencing pain but no vomiting.  The history is provided by the patient.  Back Pain Associated symptoms: abdominal pain   Associated symptoms: no fever   Abdominal Pain Associated symptoms: nausea, vaginal bleeding and vaginal discharge   Associated symptoms: no chills, no fever, no shortness of breath and no vomiting    Past Medical History:  Diagnosis Date   Anxiety    Depression     Patient Active Problem List   Diagnosis Date Noted   Bipolar 2 disorder, major depressive episode (HCC) 09/22/2020   Bipolar 1 disorder, depressed (HCC) 09/15/2020   Depression with anxiety 05/08/2018   Encounter for initial prescription of contraceptive pills 01/02/2017   Rash and nonspecific skin eruption 06/22/2013   Oppositional defiant disorder 05/22/2010   ATTENTION DEFICIT DISORDER 12/18/2009   DISRUPTIVE BEHAVIOR DISORDER 01/15/2009   REFRACTIVE AMBLYOPIA 07/26/2007    History reviewed. No pertinent surgical history.  OB History   No obstetric history on file.      Home Medications    Prior to Admission medications   Medication Sig Start Date End Date Taking? Authorizing Provider  cyclobenzaprine (FLEXERIL) 10 MG tablet Take 1 tablet (10 mg total) by mouth 2 (two) times daily as needed for muscle spasms. 02/08/21  Yes Tomi BambergerMyers, Tawana Pasch F, PA-C  predniSONE (DELTASONE) 20 MG  tablet Take 2 tablets (40 mg total) by mouth daily with breakfast for 5 days. 02/08/21 02/13/21 Yes Tomi BambergerMyers, Nickoles Gregori F, PA-C  hydrOXYzine (ATARAX) 10 MG tablet Take 1 tablet (10 mg total) by mouth every 6 (six) hours as needed for anxiety. 02/01/21   White, Patrice L, NP  QUEtiapine (SEROQUEL) 50 MG tablet Take 1 tablet (50 mg total) by mouth at bedtime. 01/26/21   Nwoko, Tommas OlpUchenna E, PA  levocetirizine (XYZAL) 5 MG tablet Take 1 tablet (5 mg total) by mouth every evening. Patient not taking: Reported on 03/17/2019 12/16/18 04/18/20  Wallis BambergMani, Mario, PA-C  sertraline (ZOLOFT) 25 MG tablet Take 3 tablets (75 mg total) by mouth daily. Patient not taking: Reported on 03/17/2019 07/09/18 04/18/20  Shon Haleimberlake, Kathryn S, MD  SPRINTEC 28 0.25-35 MG-MCG tablet TAKE 1 TABLET BY MOUTH EVERY DAY Patient not taking: No sig reported 03/05/18 04/18/20  Howard PouchFeng, Lauren, MD    Family History Family History  Problem Relation Age of Onset   Asthma Mother    Anxiety disorder Mother    Depression Mother     Social History Social History   Tobacco Use   Smoking status: Never   Smokeless tobacco: Current  Vaping Use   Vaping Use: Every day  Substance Use Topics   Alcohol use: Yes   Drug use: Yes    Types: Marijuana     Allergies   Patient has no known allergies.   Review of Systems Review of Systems  Constitutional:  Negative for chills and fever.  Eyes:  Negative for discharge and redness.  Respiratory:  Negative for shortness of breath.   Gastrointestinal:  Positive for abdominal pain and nausea. Negative for vomiting.  Genitourinary:  Positive for vaginal bleeding and vaginal discharge.  Musculoskeletal:  Positive for back pain.    Physical Exam Triage Vital Signs ED Triage Vitals  Enc Vitals Group     BP 02/08/21 1010 108/71     Pulse Rate 02/08/21 1010 66     Resp 02/08/21 1010 18     Temp 02/08/21 1010 97.7 F (36.5 C)     Temp Source 02/08/21 1010 Oral     SpO2 02/08/21 1010 98 %     Weight --       Height --      Head Circumference --      Peak Flow --      Pain Score 02/08/21 1013 8     Pain Loc --      Pain Edu? --      Excl. in GC? --    No data found.  Updated Vital Signs BP 108/71 (BP Location: Right Arm)    Pulse 66    Temp 97.7 F (36.5 C) (Oral)    Resp 18    LMP 02/04/2021 (Exact Date)    SpO2 98%      Physical Exam Vitals and nursing note reviewed.  Constitutional:      General: She is not in acute distress.    Appearance: Normal appearance. She is not ill-appearing.  HENT:     Head: Normocephalic and atraumatic.  Eyes:     Conjunctiva/sclera: Conjunctivae normal.  Cardiovascular:     Rate and Rhythm: Normal rate.  Pulmonary:     Effort: Pulmonary effort is normal.  Musculoskeletal:     Comments: Mild TTP to paraspinal musculature bilaterally to low back, no TTP to midline spine  Neurological:     Mental Status: She is alert.  Psychiatric:        Mood and Affect: Mood normal.        Behavior: Behavior normal.        Thought Content: Thought content normal.     UC Treatments / Results  Labs (all labs ordered are listed, but only abnormal results are displayed) Labs Reviewed  POCT URINALYSIS DIP (MANUAL ENTRY) - Abnormal; Notable for the following components:      Result Value   Spec Grav, UA >=1.030 (*)    Blood, UA trace-intact (*)    Protein Ur, POC trace (*)    All other components within normal limits  POCT URINE PREGNANCY    EKG   Radiology No results found.  Procedures Procedures (including critical care time)  Medications Ordered in UC Medications - No data to display  Initial Impression / Assessment and Plan / UC Course  I have reviewed the triage vital signs and the nursing notes.  Pertinent labs & imaging results that were available during my care of the patient were reviewed by me and considered in my medical decision making (see chart for details).    Suspect muscle strain-- will treat with steroid and muscle relaxer.  Encouraged  follow up with any further concerns or if symptoms fail to improve.  Final Clinical Impressions(s) / UC Diagnoses   Final diagnoses:  Acute bilateral low back pain without sciatica   Discharge Instructions   None    ED Prescriptions     Medication Sig Dispense  Auth. Provider   predniSONE (DELTASONE) 20 MG tablet Take 2 tablets (40 mg total) by mouth daily with breakfast for 5 days. 10 tablet Erma Pinto F, PA-C   cyclobenzaprine (FLEXERIL) 10 MG tablet Take 1 tablet (10 mg total) by mouth 2 (two) times daily as needed for muscle spasms. 20 tablet Tomi Bamberger, PA-C      PDMP not reviewed this encounter.   Tomi Bamberger, PA-C 02/08/21 1103

## 2021-02-08 NOTE — ED Triage Notes (Signed)
Two days of abdominal pain. Onset this morning of sudden "sharp" low back pain and nausea. Pain caused patient to drop to the floor in tears. Since arriving at [facility] patient now describes the pain as something being squeezed and not let go of. Currently on MP. Has taken midol w/o relief.

## 2021-02-10 ENCOUNTER — Telehealth (HOSPITAL_COMMUNITY): Payer: Self-pay | Admitting: Professional

## 2021-02-10 ENCOUNTER — Ambulatory Visit (HOSPITAL_COMMUNITY): Payer: Self-pay

## 2021-02-11 ENCOUNTER — Telehealth (HOSPITAL_COMMUNITY): Payer: Self-pay | Admitting: Family Medicine

## 2021-02-11 NOTE — BH Assessment (Signed)
Care Management - [facility] Follow Up Discharges   Writer attempted to make contact with patient today and was unsuccessful.  Writer left a HIPPA compliant voice message.   Per chart review, patient was referred to the [facility] (Partial Hospitalization Program) on 02-01-2021.

## 2021-02-15 ENCOUNTER — Telehealth (HOSPITAL_COMMUNITY): Payer: Self-pay | Admitting: Professional

## 2021-02-16 ENCOUNTER — Telehealth (HOSPITAL_COMMUNITY): Payer: Self-pay | Admitting: Professional

## 2021-02-16 ENCOUNTER — Encounter (HOSPITAL_COMMUNITY): Payer: Self-pay

## 2021-02-16 ENCOUNTER — Ambulatory Visit (HOSPITAL_COMMUNITY): Payer: Medicaid Other

## 2021-02-26 ENCOUNTER — Ambulatory Visit (HOSPITAL_COMMUNITY)
Admission: RE | Admit: 2021-02-26 | Discharge: 2021-02-26 | Disposition: A | Discharge status: Home / Self Care | Payer: Medicaid Other | Attending: Psychiatry | Admitting: Psychiatry

## 2021-02-26 NOTE — BH Assessment (Signed)
Comprehensive Clinical Assessment (CCA) Note  02/26/2021 [id number]  Disposition: TTS completed. Per [facility] Oneida Alar, NP, patient is psych cleared and ok to discharge. Recommended to follow up with PHP. Clinician has reached out to the [employer], LCSW managing the program @ 1615 via secure chat, requesting patient to be contacted with a start date for the program. Meanwhile, patient to follow up with Trinna Post, PA, for medication management.   Additional resources given to patient for Training and Support provided by [employer] [facility] [employer] as patient reported during her TTS Assessment that one of her stressors was lack of inability to to choose direction in her life, reporting difficulty finding a career path.   Chief Complaint:  Chief Complaint  Patient presents with   Psychiatric Evaluation   Visit Diagnosis: Major Depressive Disorder, Recurrent, Severe, w/o psychotic features; Anxiety Disorder; Cannabis Use Disorder    CCA Screening, Triage and Referral (STR)  Patient Reported Information How did you hear about [place]? Self  What Is the Reason for Your Visit/Call Today? Assistance with ongong depression and anxiety.  How Long Has This Been Causing You Problems? > than 6 months  What Do You Feel Would Help You the Most Today? Treatment for Depression or other mood problem; Alcohol or Drug Use Treatment; Medication(s); Stress Management   Have You Recently Had Any Thoughts About Hurting Yourself? Yes  Are You Planning to Commit Suicide/Harm Yourself At This time? No   Have you Recently Had Thoughts About [place]? Yes  Are You Planning to Harm Someone at This Time? No  Explanation: No data recorded  Have You Used Any Alcohol or Drugs in the Past 24 Hours? Yes  How Long Ago Did You Use Drugs or Alcohol? No data recorded What Did You Use and How Much? THC; "1 bowl, last night"   Do You Currently  Have a Therapist/Psychiatrist? Yes  Name of Therapist/Psychiatrist: Stats that she is prescribed psychiatric medications but doesn't know who prescribes the medications. Denies having a therapist at this time. However, had "many therapist " in the past.   Have You Been Recently Discharged From Any Office Practice or Programs? No  Explanation of Discharge From Practice/Program: No data recorded    CCA Screening Triage Referral Assessment Type of Contact: Face-to-Face  Telemedicine Service Delivery:   Is this Initial or Reassessment? No data recorded Date Telepsych consult ordered in [facility]:  No data recorded Time Telepsych consult ordered in [facility]L:  No data recorded Location of Assessment: [facility]  Provider Location: [facility]   Collateral Involvement: No data recorded  Does Patient Have a Cache? No data recorded Name and Contact of Legal Guardian: No data recorded If Minor and Not Living with Parent(s), Who has Custody? No data recorded Is CPS involved or ever been involved? Never  Is APS involved or ever been involved? Never   Patient Determined To Be At Risk for Harm To Self or Others Based on Review of Patient Reported Information or Presenting Complaint? No  Method: No data recorded Availability of Means: No data recorded Intent: No data recorded Notification Required: No data recorded Additional Information for Danger to Others Potential: No data recorded Additional Comments for Danger to Others Potential: No data recorded Are There Guns or Other Weapons in Your Home? No data recorded Types of Guns/Weapons: No data recorded Are These Weapons Safely Secured?  No data recorded Who Could Verify You Are Able To Have These Secured: No data recorded Do You Have any Outstanding Charges, Pending Court Dates, Parole/Probation? No data recorded Contacted To Inform of Risk of Harm To Self or  Others: No data recorded   Does Patient Present under Involuntary Commitment? No  IVC Papers Initial File Date: No data recorded  [place] of Residence: [place]   Patient Currently Receiving the Following Services: Medication Management   Determination of Need: Urgent (48 hours)   Options For Referral: Medication Management; Partial Hospitalization; Outpatient Therapy; Facility-Based Crisis; Other: Comment Engineer, agricultural Counseling @ [facility] or Voc Rehab.)     CCA Biopsychosocial Patient Reported Schizophrenia/Schizoaffective Diagnosis in Past: No   Strengths: Family support   Mental Health Symptoms Depression:   Change in energy/activity; Difficulty Concentrating; Hopelessness; Tearfulness   Duration of Depressive symptoms:  Duration of Depressive Symptoms: Greater than two weeks   Mania:   None   Anxiety:    Worrying; Tension   Psychosis:   None   Duration of Psychotic symptoms:    Trauma:   None   Obsessions:   None   Compulsions:   None   Inattention:   None   Hyperactivity/Impulsivity:   None   Oppositional/Defiant Behaviors:   None   Emotional Irregularity:   None   Other Mood/Personality Symptoms:  No data recorded   Mental Status Exam Appearance and self-care  Stature:   Average   Weight:   Average weight   Clothing:   Casual   Grooming:   Normal   Cosmetic use:   Age appropriate   Posture/gait:   Normal   Motor activity:   Not Remarkable   Sensorium  Attention:   Normal   Concentration:   Normal   Orientation:   X5   Recall/memory:   Normal   Affect and Mood  Affect:   Congruent   Mood:   Euthymic   Relating  Eye contact:   Normal   Facial expression:   Responsive   Attitude toward examiner:   Cooperative   Thought and Language  Speech flow:  Clear and Coherent   Thought content:   Appropriate to Mood and Circumstances   Preoccupation:   None   Hallucinations:   None   Organization:   No data recorded  [employer] of Knowledge:   Good   Intelligence:   Average   Abstraction:   Normal   Judgement:   Good   Reality Testing:   Adequate   Insight:   Good   Decision Making:   Normal   Social Functioning  Social Maturity:   Responsible   Social Judgement:   Normal   Stress  Stressors:   Transitions   Coping Ability:   Optician, dispensing Deficits:   Activities of daily living   Supports:   Family; Friends/Service system     Religion: Religion/Spirituality Are You A Religious Person?: No  Leisure/Recreation: Leisure / Recreation Do You Have Hobbies?: Yes  Exercise/Diet: Exercise/Diet Do You Exercise?: Yes Have You Gained or Lost A Significant Amount of Weight in the Past Six Months?: No Do You Follow a Special Diet?: No Do You Have Any Trouble Sleeping?: Yes   CCA Employment/Education Employment/Work Situation: Employment / Work Situation Employment Situation: Employed Work Stressors: Employed at [employer] and not satfisfied with her job. 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 Patient's Job has Been Impacted by Current Illness:  No Has Patient ever Been in the [place]?: No  Education: Education Is Patient Currently Attending School?: No Last Grade Completed:  (11th grade) Did [place]?: No Did You Have An Individualized Education Program (IIEP): No Did You Have Any Difficulty At School?: No Patient's Education Has Been Impacted by Current Illness: No   CCA Family/Childhood History Family and Relationship History: Family history Marital status: Single Does patient have children?: No  Childhood History:  Childhood History By whom was/is the patient raised?: Mother Description of patient's current relationship with siblings: Client reported she has a 67 year old sister whom she has a good relationship with Did patient suffer any  verbal/emotional/physical/sexual abuse as a child?: No Did patient suffer from severe childhood neglect?: No Has patient ever been sexually abused/assaulted/raped as an adolescent or adult?: No Was the patient ever a victim of a crime or a disaster?: No Witnessed domestic violence?: Yes Has patient been affected by domestic violence as an adult?: No  Child/Adolescent Assessment:     CCA Substance Use Alcohol/Drug Use: Alcohol / Drug Use Pain Medications: SEE MAR Prescriptions: SEE MAR Over the Counter: SEE MAR History of alcohol / drug use?: Yes Substance #1 Name of Substance 1: Marijuana 1 - Age of First Use: 21 y/o 1 - Amount (size/oz): 1 bowl 1 - Frequency: Daily-once per day 1 - Duration: on-going 1 - Last Use / Amount: 02/25/2021; 1 bowl 1 - Method of Aquiring: Acquaintances 1- Route of Use: Smoking                       ASAM's:  Six Dimensions of Multidimensional Assessment  Dimension 1:  Acute Intoxication and/or Withdrawal Potential:   Dimension 1:  Description of individual's past and current experiences of substance use and withdrawal: Client reported no history of substance use treatment  Dimension 2:  Biomedical Conditions and Complications:   Dimension 2:  Description of patient's biomedical conditions and  complications: Client reported no medical conditions caused and/or affected by marijuana use  Dimension 3:  Emotional, Behavioral, or Cognitive Conditions and Complications:  Dimension 3:  Description of emotional, behavioral, or cognitive conditions and complications: Client reported recurrent history of depression and anxiety  Dimension 4:  Readiness to Change:  Dimension 4:  Description of Readiness to Change criteria: Client is in the precontemplation stage of change  Dimension 5:  Relapse, Continued use, or Continued Problem Potential:  Dimension 5:  Relapse, continued use, or continued problem potential critiera description: Client reported she  continues to use on a daily basis  Dimension 6:  Recovery/Living Environment:  Dimension 6:  Recovery/Iiving environment criteria description: Client reported she has positive support from family  ASAM Severity Score: ASAM's Severity Rating Score: 5  ASAM Recommended Level of Treatment: ASAM Recommended Level of Treatment: Level I Outpatient Treatment   Substance use Disorder (SUD) Substance Use Disorder (SUD)  Checklist Symptoms of Substance Use: Presence of craving or strong urge to use  Recommendations for Services/Supports/Treatments: Recommendations for Services/Supports/Treatments Recommendations For Services/Supports/Treatments: Medication Management, Individual Therapy, Partial Hospitalization, IOP (Intensive Outpatient Program), Supported Employment  Discharge Disposition:    DSM5 Diagnoses: Patient Active Problem List   Diagnosis Date Noted   Bipolar 2 disorder, major depressive episode ([place]) 09/22/2020   Bipolar 1 disorder, depressed ([place]) 09/15/2020   Depression with anxiety 05/08/2018   Encounter for initial prescription of contraceptive pills 01/02/2017   Rash and nonspecific skin eruption 06/22/2013   Oppositional defiant disorder 05/22/2010   ATTENTION  DEFICIT DISORDER 12/18/2009   DISRUPTIVE BEHAVIOR DISORDER 01/15/2009   REFRACTIVE AMBLYOPIA 07/26/2007     Referrals to Alternative Service(s): Referred to Alternative Service(s):   Place:   Date:   Time:    Referred to Alternative Service(s):   Place:   Date:   Time:    Referred to Alternative Service(s):   Place:   Date:   Time:    Referred to Alternative Service(s):   Place:   Date:   Time:     Waldon Merl, Counselor

## 2021-02-26 NOTE — H&P (Signed)
[place] Screening Exam  Michaela Jennings is an 21 y.o. female with past history of bipolar I, II, Major Depression Disorder, ODD who presented to [facility] alone for assessment of anxiety and depression after leaving work early. On assessment patient initially expressed feeling overwhelmed with her current status in life. She would discuss dropping out of school 11/12th grade stating her mother and therapist felt she would do better "home-schooling", states she "doesn't remember why" but she never finished. She reports since dropping out she has moved in with her now ex-boyfriend and currently works at [employer] in which she states is not enough financially. States she currently feels "lost and I have no idea". She reports daily marijuana use. Currently receives outpatient medication management via [facility]; prescribed Quetiapine, last dose "this week". No outpatient therapy in place at this time. Thought pattern initially scattered; frequent use of filler words and phrases "I don't know", "I'm not sure". As assessment progressed patient able to more clearly express thoughts and feelings. She discussed her childhood; frequently moving due to mother's financial instability. Denies any childhood trauma; reports mental illness and substance abuse in both parents, currently estranged from father. Reports past history of depression, passive suicidal thoughts, recent thoughts of hopelessness and worthlessness related to lack of career path, however she insists she "does not want to die". She endorses good sleep, active interest and "a few friends", feelings of guilt, fair energy, poor concentration, inconsistent appetite, denies any psychomotor changes, and passive suicidal ideations ("would it matter if I didn't wake up") dating back to "teen years". She denies any self harming behaviors, plan or intent to self harm or die.   Provider spent extensive time via motivational interviewing discussing marijuana  use, financial woes, and feelings of "not knowing what's next"; states ex currently works as an Clinical biochemist and is on a career path while she is not. Provider discussed at length the effects of chronic marijuana use on her depression. Patient states she needs help "with direction"; provider discussed outpatient therapy, partial hospitalization and local vocational rehab programs at length. Patient expressed interest in resources and relief in "talking to someone". She denies any safety concerns returning home; provided verbal permission to call ex-boyfriend for further collateral information.   Collateral: Thurmond Butts (ex-boyfriend) [phone] (no answer)    Total Time spent with patient: 20 minutes  Psychiatric Specialty Exam: Physical Exam Vitals and nursing note reviewed.  Constitutional:      General: She is not in acute distress.    Appearance: She is normal weight. She is not ill-appearing.  HENT:     Head: Normocephalic.     Nose: Nose normal.     Mouth/Throat:     Mouth: Mucous membranes are moist.     Pharynx: Oropharynx is clear.  Eyes:     Pupils: Pupils are equal, round, and reactive to light.  Cardiovascular:     Rate and Rhythm: Normal rate.     Pulses: Normal pulses.  Pulmonary:     Effort: Pulmonary effort is normal.  Abdominal:     General: Abdomen is flat.  Musculoskeletal:        General: Normal range of motion.     Cervical back: Normal range of motion.  Skin:    General: Skin is warm and dry.  Neurological:     General: No focal deficit present.     Mental Status: She is alert and oriented to person, place, and time. Mental status is at baseline.  Psychiatric:  Attention and Perception: Attention and perception normal.        Mood and Affect: Mood and affect normal.        Speech: Speech normal.        Behavior: Behavior normal. Behavior is cooperative.        Thought Content: Thought content normal. Thought content is not paranoid or delusional. Thought  content does not include homicidal or suicidal ideation. Thought content does not include homicidal or suicidal plan.        Cognition and Memory: Cognition and memory normal.        Judgment: Judgment normal.   Review of Systems  Psychiatric/Behavioral:  Positive for decreased concentration. Negative for self-injury, sleep disturbance and suicidal ideas. The patient is nervous/anxious.   All other systems reviewed and are negative. Last menstrual period 02/04/2021.There is no height or weight on file to calculate BMI. General Appearance: Casual Eye Contact:  Fair Speech:  Clear and Coherent Volume:  Normal Mood:  Dysphoric Affect:  Congruent Thought Process:  Coherent Orientation:  Full (Time, Place, and Person) Thought Content:  Logical and Tangential Suicidal Thoughts:  No Homicidal Thoughts:  No Memory:  Immediate;   Fair Recent;   Fair Remote;   Fair Judgement:  Fair Insight:  Fair Psychomotor Activity:  Normal Concentration: Concentration: Fair and Attention Span: Fair Recall:  [employer] of [place]: Fair Akathisia:  NA Handed:  Right AIMS (if indicated):    Assets:  Communication Skills Housing Physical Health Resilience Social Support Sleep:     Musculoskeletal: Strength & Muscle Tone: within normal limits Gait & Station: normal Patient leans: N/A  Last menstrual period 02/04/2021.  Recommendations: Based on my evaluation the patient does not appear to have an emergency medical condition. Patient provided outpatient, vocational, and PHP resources for individual follow-up. She denies any suicidal or homicidal ideations and contracts for safety; plan to continue to follow-up with [facility] for outpatient. Emergency resources discussed in case of any changes in mental state; patient verbalized an understanding. PHP referral completed by counselor Marlou Porch.    Inda Merlin, NP 02/26/2021, 1:08 PM

## 2021-03-03 ENCOUNTER — Telehealth (HOSPITAL_COMMUNITY): Payer: Self-pay | Admitting: Professional

## 2021-03-09 ENCOUNTER — Ambulatory Visit (HOSPITAL_COMMUNITY): Payer: Medicaid Other | Admitting: Professional

## 2021-03-09 DIAGNOSIS — F411 Generalized anxiety disorder: Secondary | ICD-10-CM | POA: Insufficient documentation

## 2021-03-09 DIAGNOSIS — F332 Major depressive disorder, recurrent severe without psychotic features: Secondary | ICD-10-CM

## 2021-03-11 ENCOUNTER — Ambulatory Visit (INDEPENDENT_AMBULATORY_CARE_PROVIDER_SITE_OTHER): Payer: Medicaid Other | Admitting: Licensed Clinical Social Worker

## 2021-03-11 DIAGNOSIS — F411 Generalized anxiety disorder: Secondary | ICD-10-CM

## 2021-03-11 DIAGNOSIS — F332 Major depressive disorder, recurrent severe without psychotic features: Secondary | ICD-10-CM

## 2021-03-11 NOTE — Psych (Signed)
Virtual Visit via Video Note  I connected with Michaela Jennings on 03/09/21 at 12:00 PM EST by a video enabled telemedicine application and verified that I am speaking with the correct person using two identifiers.  Location: Patient: Home Provider: Clinical Home Office   I discussed the limitations of evaluation and management by telemedicine and the availability of in person appointments. The patient expressed understanding and agreed to proceed.  Follow Up Instructions:    I discussed the assessment and treatment plan with the patient. The patient was provided an opportunity to ask questions and all were answered. The patient agreed with the plan and demonstrated an understanding of the instructions.   The patient was advised to call back or seek an in-person evaluation if the symptoms worsen or if the condition fails to improve as anticipated.  I provided 60 minutes of non-face-to-face time during this encounter.   Michaela Jennings, [facility]     Comprehensive Clinical Assessment (CCA) Note  03/09/2021 Michaela Jennings [id number]  Chief Complaint:  Chief Complaint  Patient presents with   Depression   Anxiety   Follow-up   Visit Diagnosis: MDD; GAD    CCA Screening, Triage and Referral (STR)  Patient Reported Information How did you hear about [place]? Other (Comment)  Referral name: [facility]  Referral phone number: No data recorded  Whom do you see for routine medical problems? Primary Care  [facility]  Practice/Facility Phone Number: No data recorded Name of Contact: No data recorded Contact Number: No data recorded Contact Fax Number: No data recorded Prescriber Name: No data recorded Prescriber Address (if known): No data recorded  What Is the Reason for Your Visit/Call Today? depression; anxiety  How Long Has This Been Causing You Problems? > than 6 months  What Do You Feel Would Help You the Most Today? Treatment for Depression  or other mood problem   Have You Recently Been in Any Inpatient Treatment (Hospital/Detox/Crisis Center/28-Day Program)? No  Name/Location of Program/Hospital:No data recorded How Long Were You There? No data recorded When Were You Discharged? No data recorded  Have You Ever Received Services From [facility] Before? Yes  Who Do You See at [facility]? No data recorded  Have You Recently Had Any Thoughts About Hurting Yourself? Yes Michaela Jennings, I didn't do anything because I was babysitting. It was intense." Pt denies making a plan; pt reports "I didn't care. I just didn't want to be here because I was in pain.")  Are You Planning to Commit Suicide/Harm Yourself At This time? No   Have you Recently Had Thoughts About [place]? No  Explanation: No data recorded  Have You Used Any Alcohol or Drugs in the Past 24 Hours? Yes  How Long Ago Did You Use Drugs or Alcohol? No data recorded What Did You Use and How Much? Marijuana: 1 bowl- doesn't know how much   Do You Currently Have a Therapist/Psychiatrist? Yes  Name of Therapist/Psychiatrist: Psychiatrist: Eddie at [facility]; no therapist   Have You Been Recently Discharged From Any Office Practice or Programs? No  Explanation of Discharge From Practice/Program: No data recorded    CCA Screening Triage Referral Assessment Type of Contact: Tele-Assessment  Is this Initial or Reassessment? Initial Assessment  Date Telepsych consult ordered in [facility]:  No data recorded Time Telepsych consult ordered in [facility]:  No data recorded  Patient Reported Information Reviewed? No data recorded Patient Left Without Being Seen? No data recorded Reason for Not  Completing Assessment: No data recorded  Collateral Involvement: notes   Does Patient Have a [place]? No data recorded Name and Contact of Legal Guardian: No data recorded If Minor and Not Living with Parent(s), Who has Custody? No data recorded Is CPS  involved or ever been involved? Never  Is APS involved or ever been involved? Never   Patient Determined To Be At Risk for Harm To Self or Others Based on Review of Patient Reported Information or Presenting Complaint? No  Method: No data recorded Availability of Means: No data recorded Intent: No data recorded Notification Required: No data recorded Additional Information for Danger to Others Potential: No data recorded Additional Comments for Danger to Others Potential: No data recorded Are There Guns or Other Weapons in Your Home? No data recorded Types of Guns/Weapons: No data recorded Are These Weapons Safely Secured?                            No data recorded Who Could Verify You Are Able To Have These Secured: No data recorded Do You Have any Outstanding Charges, Pending Court Dates, Parole/Probation? No data recorded Contacted To Inform of Risk of Harm To Self or Others: No data recorded  Location of Assessment: Other (comment)   Does Patient Present under Involuntary Commitment? No  IVC Papers Initial File Date: No data recorded  [place] of Residence: [place]   Patient Currently Receiving the Following Services: Medication Management   Determination of Need: Urgent (48 hours)   Options For Referral: Partial Hospitalization     CCA Biopsychosocial Intake/Chief Complaint:  Pt reports per [facility] referral. Pt reports history of ODD, ADHD, anxiety, and depression diagnoses. Pt reports she has been told she may have BPD. 1) Finances: Pt reports stressed about finances since being a kid. Mom was a single parents and struggled financially. Pt reports feeling dependent on others because she lacks money to support self. Pt reports we always lived with someone else until I was 84 when my mom was finally able to get her own place. 2) Future: Pt reports she is concerned about not having a future because she is working at [employer] which is fine but doesnt want to continue  doing that forever. 3) Living situation: Pt reports she lives with her ex in a 1 bedroom apartment and they have 3 animals. Pt reports she recently realized she still loves him and wants to be with him. Pt reports I pulled the plug right after Halloween. 4) Grandfather: Pt reports her maternal grandfather has cancer. 5) MH: Pt reports I hate hurting my family. I know they love me and support me. But I constantly feel I am damaged. I feel like I am acting every day instead of being who I really am which is confusing. 6) Relationships: Pt reports recent losses of friendships due to a messed up situation. Pt treatment history includes several therapists since [place]- never stuck with one; denies current; Michaela Jennings at [facility] for med management- seen 1x. Pt denies hospitalizations or attempts. Pt reports recent passive SI: "Yesterday, I didn't do anything because I was babysitting. It was intense." Pt denies making a plan; pt reports "I didn't care. I just didn't want to be here because I was in pain." Denies HI/AVH. Pt reports supports include Mom, 10yo sister, and Aunt.  Current Symptoms/Problems: passive SI, increased anxiety, increased depression   Patient Reported Schizophrenia/Schizoaffective Diagnosis in Past: No   Strengths:  Family support;  Preferences: to get better  Abilities: can attend and partcipate in treatment   Type of Services Patient Feels are Needed: PHP   Initial Clinical Notes/Concerns: rule out for BPD?   Mental Health Symptoms Depression:   Change in energy/activity; Difficulty Concentrating; Hopelessness; Tearfulness; Fatigue; Irritability; Increase/decrease in appetite; Worthlessness   Duration of Depressive symptoms:  Greater than two weeks   Mania:   None   Anxiety:    Worrying; Tension; Irritability; Fatigue; Difficulty concentrating   Psychosis:   None   Duration of Psychotic symptoms: No data recorded  Trauma:   None   Obsessions:   None    Compulsions:   None   Inattention:   None   Hyperactivity/Impulsivity:   None   Oppositional/Defiant Behaviors:   None   Emotional Irregularity:   Chronic feelings of emptiness; Mood lability; Unstable self-image   Other Mood/Personality Symptoms:  No data recorded   Mental Status Exam Appearance and self-care  Stature:   Average   Weight:   Average weight   Clothing:   Casual   Grooming:   Normal   Cosmetic use:   Age appropriate   Posture/gait:   Normal   Motor activity:   Not Remarkable   Sensorium  Attention:   Distractible   Concentration:   Anxiety interferes   Orientation:   X5   Recall/memory:   Normal   Affect and Mood  Affect:   Congruent; Anxious; Depressed; Tearful   Mood:   Depressed; Anxious   Relating  Eye contact:   None   Facial expression:   Responsive; Depressed; Anxious   Attitude toward examiner:   Cooperative   Thought and Language  Speech flow:  Clear and Coherent   Thought content:   Appropriate to Mood and Circumstances   Preoccupation:   None   Hallucinations:   None   Organization:  No data recorded  [employer] of Knowledge:   Average   Intelligence:   Average   Abstraction:   Normal   Judgement:   Fair   Reality Testing:   Adequate   Insight:   Gaps   Decision Making:   Normal; Impulsive; Vacilates   Social Functioning  Social Maturity:   Responsible   Social Judgement:   Normal   Stress  Stressors:   Transitions; Grief/losses; Financial; Relationship; Illness; Work   Coping Ability:   Optician, dispensing Deficits:   Activities of daily living   Supports:   Family; Friends/Service system     Religion: Religion/Spirituality Are You A Religious Person?: Yes What is Your Religious Affiliation?: International aid/development worker: Leisure / Recreation Do You Have Hobbies?: No  Exercise/Diet: Exercise/Diet Do You Exercise?: No Have You Gained or  Lost A Significant Amount of Weight in the Past Six Months?: No Do You Follow a Special Diet?: No Do You Have Any Trouble Sleeping?: Yes Explanation of Sleeping Difficulties: Pt reports sleep is better with meds   CCA Employment/Education Employment/Work Situation: Employment / Work Situation Employment Situation: Employed Where is Patient Currently Employed?: [employer] Are You Satisfied With Your Job?: No Do You Work More Than One Job?: No Work Stressors: Employed at [employer] and not satfisfied with her job. 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 Has Patient ever Been in  the [employer]?: No  Education: Education Is Patient Currently Attending School?: No Last Grade Completed:  (11th grade) Did You Graduate From [employer]?: No Did [place]?: No Did You Have An Individualized Education Program (IIEP): No Did You Have Any Difficulty At School?: Yes Were Any Medications Ever Prescribed For These Difficulties?: Yes Medications Prescribed For School Difficulties?: ADHD, ODD Patient's Education Has Been Impacted by Current Illness: No   CCA Family/Childhood History Family and Relationship History: Family history Marital status: Single Are you sexually active?: No What is your sexual orientation?: "I'm not 100% sure." Does patient have children?: No  Childhood History:  Childhood History By whom was/is the patient raised?: Mother Additional childhood history information: Pt reports childhood was tought at times due to financial issues. Client reported she is from [place] and was raised by her mother and mother side of the family. Client reported she had little to no contact with her biological father during her childhood. Client reported she had a choice to  speak with them but she chose not to. Client reported when her mother was with her sister's father she was exposed to traumatic experiences of frequently moving in and out of the house because they fought a lot. Client reported her sister's father intentionally harmed himself and called the police to appointment on her mother. Description of patient's relationship with caregiver when they were a child: Good with mom Patient's description of current relationship with people who raised him/her: Good with mom Does patient have siblings?: Yes Number of Siblings: 1 Description of patient's current relationship with siblings: Client reported she has a 66 year old sister whom she has a good relationship with Did patient suffer any verbal/emotional/physical/sexual abuse as a child?: No Did patient suffer from severe childhood neglect?: No Has patient ever been sexually abused/assaulted/raped as an adolescent or adult?: No Was the patient ever a victim of a crime or a disaster?: No Witnessed domestic violence?: Yes Has patient been affected by domestic violence as an adult?: No  Child/Adolescent Assessment:     CCA Substance Use Alcohol/Drug Use: Alcohol / Drug Use Pain Medications: SEE MAR Prescriptions: SEE MAR Over the Counter: SEE MAR History of alcohol / drug use?: Yes Substance #1 Name of Substance 1: Marijuana 1 - Age of First Use: 21 y/o 1 - Amount (size/oz): 1 bowl 1 - Frequency: Daily-once per day 1 - Duration: on-going 1 - Last Use / Amount: 02/25/2021; 1 bowl 1 - Method of Aquiring: Acquaintances 1- Route of Use: Oral                       ASAM's:  Six Dimensions of Multidimensional Assessment  Dimension 1:  Acute Intoxication and/or Withdrawal Potential:   Dimension 1:  Description of individual's past and current experiences of substance use and withdrawal: Client reported no history of substance use treatment  Dimension 2:  Biomedical Conditions and  Complications:   Dimension 2:  Description of patient's biomedical conditions and  complications: Client reported no medical conditions caused and/or affected by marijuana use  Dimension 3:  Emotional, Behavioral, or Cognitive Conditions and Complications:  Dimension 3:  Description of emotional, behavioral, or cognitive conditions and complications: Client reported recurrent history of depression and anxiety  Dimension 4:  Readiness to Change:  Dimension 4:  Description of Readiness to Change criteria: Client is in the precontemplation stage of change  Dimension 5:  Relapse, Continued use, or Continued Problem Potential:  Dimension 5:  Relapse,  continued use, or continued problem potential critiera description: Client reported she continues to use on a daily basis  Dimension 6:  Recovery/Living Environment:  Dimension 6:  Recovery/Iiving environment criteria description: Client reported she has positive support from family  ASAM Severity Score: ASAM's Severity Rating Score: 5  ASAM Recommended Level of Treatment: ASAM Recommended Level of Treatment: Level I Outpatient Treatment   Substance use Disorder (SUD) Substance Use Disorder (SUD)  Checklist Symptoms of Substance Use: Presence of craving or strong urge to use  Recommendations for Services/Supports/Treatments: Recommendations for Services/Supports/Treatments Recommendations For Services/Supports/Treatments: Partial Hospitalization  DSM5 Diagnoses: Patient Active Problem List   Diagnosis Date Noted   Major depressive disorder, recurrent episode, severe ([place]) 03/09/2021   Generalized anxiety disorder 03/09/2021   Bipolar 2 disorder, major depressive episode ([place]) 09/22/2020   Bipolar 1 disorder, depressed ([place]) 09/15/2020   Depression with anxiety 05/08/2018   Encounter for initial prescription of contraceptive pills 01/02/2017   Rash and nonspecific skin eruption 06/22/2013   Oppositional defiant disorder 05/22/2010   ATTENTION  DEFICIT DISORDER 12/18/2009   DISRUPTIVE BEHAVIOR DISORDER 01/15/2009   REFRACTIVE AMBLYOPIA 07/26/2007    Patient Centered Plan: Patient is on the following Treatment Plan(s):  Depression   Referrals to Alternative Service(s): Referred to Alternative Service(s):   Place:   Date:   Time:    Referred to Alternative Service(s):   Place:   Date:   Time:    Referred to Alternative Service(s):   Place:   Date:   Time:    Referred to Alternative Service(s):   Place:   Date:   Time:     Michaela Jennings, [facility]

## 2021-03-11 NOTE — Psych (Signed)
Virtual Visit via Video Note  I connected with Michaela Jennings on 03/11/21 at  9:00 AM EST by a video enabled telemedicine application and verified that I am speaking with the correct person using two identifiers.  Location: Patient: Home Provider: Clinical Home office   I discussed the limitations of evaluation and management by telemedicine and the availability of in person appointments. The patient expressed understanding and agreed to proceed.   Follow Up Instructions:    I discussed the assessment and treatment plan with the patient. The patient was provided an opportunity to ask questions and all were answered. The patient agreed with the plan and demonstrated an understanding of the instructions.   The patient was advised to call back or seek an in-person evaluation if the symptoms worsen or if the condition fails to improve as anticipated.  I provided 5 minutes of non-face-to-face time during this encounter.   Michaela Jennings [name], LCMHCA  [name], [employer], met with pt and cln , Michaela Jennings, to discuss ROI. Pt completed paperwork in office but did not list an emergency contact on ROI. Pt gives verbal consent to contact [name], mother, at [phone] in case of emergency for the duration of group. Pt verbally agreed at 915a.

## 2021-03-11 NOTE — Progress Notes (Unsigned)
Virtual Visit via Video Note  I connected with Michaela Jennings on 03/11/21 at  9:00 AM EST by a video enabled telemedicine application and verified that I am speaking with the correct person using two identifiers.  Location: Patient: Home Provider: Clinical office   I discussed the limitations of evaluation and management by telemedicine and the availability of in person appointments. The patient expressed understanding and agreed to proceed.   Follow Up Instructions:    I discussed the assessment and treatment plan with the patient. The patient was provided an opportunity to ask questions and all were answered. The patient agreed with the plan and demonstrated an understanding of the instructions.   The patient was advised to call back or seek an in-person evaluation if the symptoms worsen or if the condition fails to improve as anticipated.  I provided 5 minutes of non-face-to-face time during this encounter.   [name], LCSWA  [name], [name], met with pt and cln , [name], to discuss ROI. Pt completed paperwork in office but did not list an emergency contact on ROI. Pt gives verbal consent to contact [name], mother, at [phone] in case of emergency for the duration of group. Pt verbally agreed at 915a.

## 2021-03-12 ENCOUNTER — Ambulatory Visit (INDEPENDENT_AMBULATORY_CARE_PROVIDER_SITE_OTHER): Payer: Medicaid Other | Admitting: Licensed Clinical Social Worker

## 2021-03-12 ENCOUNTER — Telehealth (HOSPITAL_COMMUNITY): Payer: Self-pay | Admitting: Licensed Clinical Social Worker

## 2021-03-12 DIAGNOSIS — F332 Major depressive disorder, recurrent severe without psychotic features: Secondary | ICD-10-CM

## 2021-03-12 DIAGNOSIS — F411 Generalized anxiety disorder: Secondary | ICD-10-CM

## 2021-03-12 DIAGNOSIS — F3181 Bipolar II disorder: Secondary | ICD-10-CM

## 2021-03-12 NOTE — Progress Notes (Signed)
[name] was contacted by Corena Pilgrim, case manager with [facility]). Tresa Endo stated pt is authorized for seven days of PHP, 2/2-2/10, with a review due on 2/10. The authorization number is [id number] and [facility] phone number is [phone]. [phone] (HIPAA-compliant VM).

## 2021-03-13 ENCOUNTER — Encounter (HOSPITAL_COMMUNITY): Payer: Self-pay | Admitting: Professional

## 2021-03-13 NOTE — Progress Notes (Signed)
Virtual Visit via Video Note  I connected with Michaela SaltsSavannah Clink on 03/12/21 at  9:00 AM EST by a video enabled telemedicine application and verified that I am speaking with the correct person using two identifiers.  Location: Patient: Home Provider: Office   I discussed the limitations of evaluation and management by telemedicine and the availability of in person appointments. The patient expressed understanding and agreed to proceed.   I discussed the assessment and treatment plan with the patient. The patient was provided an opportunity to ask questions and all were answered. The patient agreed with the plan and demonstrated an understanding of the instructions.   The patient was advised to call back or seek an in-person evaluation if the symptoms worsen or if the condition fails to improve as anticipated.  I provided 15 minutes of non-face-to-face time during this encounter.   Oneta Rackanika N Kevina Piloto, NP    Psychiatric Initial Adult Assessment   Patient Identification: Michaela SaltsSavannah Dudenhoeffer MRN:  [id number] Date of Evaluation:  03/13/2021 [facility] Chief Complaint:  depression  Visit Diagnosis:    ICD-10-CM   1. Generalized anxiety disorder  F41.1     2. Severe episode of recurrent major depressive disorder, without psychotic features (HCC)  F33.2     3. Bipolar 2 disorder, major depressive episode (HCC)  F31.81       History of Present Illness:  Michaela Jennings is a 21 year old female that presents with worsening depression and anxiety.  States ' I needed some extra help".  Denied previous inpatient admissions.  Denies previous suicide attempts plan or intent.  States she is currently prescribed Seroquel hydroxyzine for depression, anxiety oppositional defiant disorder and attention deficit disorder.  States unsure of her recent diagnosis with bipolar disorder.  However states it was alluded to in her visit to her primary care provider.  She reports a recent break-up with the  boyfriend and was hopeful about and can get back together again and they reside together in the home.  Reports a good appetite.  States she is resting well throughout the night. [facility]  Per admission assessment note: " Michaela SaltsSavannah Schuld reports having a "breakdown" at work earlier today. She states that she was waiting on a customer and for no reason she began crying. She reports that she is a bubbly person and it is unusual for her to not greet or talk to the customers. She states that this morning she did not feel like going to work but was able to get herself up and go. She states that when she got to work this morning she did not feel like talking or greeting her customers. She is unable to identify any triggers or stressors attributing to changes in her mood. She states that it could be anything triggering her, she experienced a lot of past trauma. She does not further elaborate on past trauma experiences."      Associated Signs/Symptoms: Depression Symptoms:  depressed mood, feelings of worthlessness/guilt, difficulty concentrating, anxiety, (Hypo) Manic Symptoms:  Distractibility, Anxiety Symptoms:  Excessive Worry, Psychotic Symptoms:  Hallucinations: None PTSD Symptoms: Avoidance:  Decreased Interest/Participation  Past Psychiatric History:   Previous Psychotropic Medications: No   Substance Abuse History in the last 12 months:  No.  Consequences of Substance Abuse: NA  Past Medical History:  Past Medical History:  Diagnosis Date   Anxiety    Depression    No past surgical history on file.  Family Psychiatric History:   Family History:  Family History  Problem Relation Age of Onset   Asthma Mother    Anxiety disorder Mother    Depression Mother     Social History:   Social History   Socioeconomic History   Marital status: Single    Spouse name: Not on file   Number of children: Not on file   Years of education: Not on file   Highest  education level: Not on file  Occupational History   Not on file  Tobacco Use   Smoking status: Never   Smokeless tobacco: Current  Vaping Use   Vaping Use: Every day  Substance and Sexual Activity   Alcohol use: Yes   Drug use: Yes    Types: Marijuana   Sexual activity: Not Currently    Birth control/protection: None    Comment: not sexually active x 1 month  Other Topics Concern   Not on file  Social History Narrative   Lives with mother.  Father and her mother recently separated.  Mother is pregnant with baby girl, due August 16.   Social Determinants of Health   Financial Resource Strain: Not on file  Food Insecurity: Not on file  Transportation Needs: Not on file  Physical Activity: Not on file  Stress: Not on file  Social Connections: Not on file    Additional Social History:   Allergies:  No Known Allergies  Metabolic Disorder Labs: No results found for: HGBA1C, MPG No results found for: PROLACTIN No results found for: CHOL, TRIG, HDL, CHOLHDL, VLDL, LDLCALC Lab Results  Component Value Date   TSH 1.776 07/11/2014    Therapeutic Level Labs: No results found for: LITHIUM No results found for: CBMZ No results found for: VALPROATE  Current Medications: Current Outpatient Medications  Medication Sig Dispense Refill   cyclobenzaprine (FLEXERIL) 10 MG tablet Take 1 tablet (10 mg total) by mouth 2 (two) times daily as needed for muscle spasms. 20 tablet 0   hydrOXYzine (ATARAX) 10 MG tablet Take 1 tablet (10 mg total) by mouth every 6 (six) hours as needed for anxiety. 30 tablet 0   QUEtiapine (SEROQUEL) 50 MG tablet Take 1 tablet (50 mg total) by mouth at bedtime. 30 tablet 1   No current facility-administered medications for this visit.    Musculoskeletal: Strength & Muscle Tone: within normal limits Gait & Station: normal Patient leans: N/A  Psychiatric Specialty Exam: Review of Systems  There were no vitals taken for this visit.There is no height  or weight on file to calculate BMI.  General Appearance: Negative  Eye Contact:  Good  Speech:  Clear and Coherent  Volume:  Normal  Mood:  Anxious  Affect:  Congruent  Thought Process:  Coherent  Orientation:  Full (Time, Place, and Person)  Thought Content:  Logical  Suicidal Thoughts:  No  Homicidal Thoughts:  No  Memory:  Immediate;   Good Recent;   Good  Judgement:  Good  Insight:  Good  Psychomotor Activity:  Normal  Concentration:  Concentration: Good  Recall:  Good  Fund of Knowledge:Good  Language: Good  Akathisia:  No  Handed:  Right  AIMS (if indicated):  done  Assets:  Communication Skills Desire for Improvement Resilience Social Support  ADL's:  Intact  Cognition: WNL  Sleep:  Negative   Screenings: GAD-7    Advertising copywriter from 01/26/2021 in [facility] Office Visit from 09/22/2020 in [facility] Office Visit from 08/31/2020 in [facility]  Total GAD-7 Score 9 6 18       PHQ2-9    Flowsheet Row Counselor from 03/09/2021 in [facility] Counselor from 01/26/2021 in [facility] Office Visit from 10/30/2020 in [facility] Office Visit from 09/22/2020 in [facility] Office Visit from 09/15/2020 in [facility]  PHQ-2 Total Score 5 4 2 2 6   PHQ-9 Total Score 19 12 10 8 23       Flowsheet Row Counselor from 03/09/2021 in [facility] ED from 02/08/2021 in [facility]  Office Visit from 01/26/2021 in [facility]  C-SSRS RISK CATEGORY No Risk No Risk Low Risk       Assessment and Plan:  Patient to start partial hospitalization programming for [facility] urgent care Continue medications as indicated  Treatment plan was reviewed and agreed upon by NP T. Anayelli Lai inpatient [facility] need for group services   [facility], NP 2/4/20236:54 PM

## 2021-03-15 ENCOUNTER — Ambulatory Visit (INDEPENDENT_AMBULATORY_CARE_PROVIDER_SITE_OTHER): Payer: Medicaid Other | Admitting: Licensed Clinical Social Worker

## 2021-03-15 ENCOUNTER — Encounter (HOSPITAL_COMMUNITY): Payer: Self-pay

## 2021-03-15 DIAGNOSIS — F411 Generalized anxiety disorder: Secondary | ICD-10-CM

## 2021-03-15 DIAGNOSIS — F332 Major depressive disorder, recurrent severe without psychotic features: Secondary | ICD-10-CM

## 2021-03-16 ENCOUNTER — Encounter (HOSPITAL_COMMUNITY): Payer: Self-pay | Admitting: Professional

## 2021-03-16 ENCOUNTER — Ambulatory Visit (INDEPENDENT_AMBULATORY_CARE_PROVIDER_SITE_OTHER): Payer: Medicaid Other | Admitting: Licensed Clinical Social Worker

## 2021-03-16 DIAGNOSIS — F332 Major depressive disorder, recurrent severe without psychotic features: Secondary | ICD-10-CM

## 2021-03-16 DIAGNOSIS — F3181 Bipolar II disorder: Secondary | ICD-10-CM

## 2021-03-16 NOTE — Progress Notes (Unsigned)
Virtual Visit via Video Note  I connected with Michaela Jennings on 03/16/21 at  9:00 AM EST by a video enabled telemedicine application and verified that I am speaking with the correct person using two identifiers.  Location: Patient: home Provider: Office   I discussed the limitations of evaluation and management by telemedicine and the availability of in person appointments. The patient expressed understanding and agreed to proceed.   I discussed the assessment and treatment plan with the patient. The patient was provided an opportunity to ask questions and all were answered. The patient agreed with the plan and demonstrated an understanding of the instructions.   The patient was advised to call back or seek an in-person evaluation if the symptoms worsen or if the condition fails to improve as anticipated.  I provided 15 minutes of non-face-to-face time during this encounter.   Michaela Rack, NP   [facility] MD/PA/NP OP Progress Note  03/16/2021 3:13 PM Michaela Jennings  MRN:  [id number]  Chief Complaint:  Depression, but " I am okay." HPI: Michaela Jennings was seen and evaluated.  Telemetry assessment.  She presents bright and bubbly however states feeling sad and depressed due to recent conversation between she and her ex-boyfriend who she resides with.    States "we had a good talk on last night and decided it is better that we not stay together in a relationship" .  States "I am sad however recognize that she has some growth has happened because I didn't want to kill myself after we discussed not been a relationship together."    he reports increased anxiety and she states her anxiety is 8 out of 10 with 10 being the worst and her depression is 9 out of 10 with 10 being the worst. Reported she has been taken her medication however haven't picked up her refill to Seroquel as of yet.   She reports a good appetite.  States she is resting well throughout the night.  States she is can continue working  on her coping skills.  Patient has a follow-up appointment with primary care mental health provider.  Patient to continue outpatient programming.  Support,  encouragement and  reassurance was provided.  Visit Diagnosis:    ICD-10-CM   1. Bipolar 2 disorder, major depressive episode (HCC)  F31.81     2. Severe episode of recurrent major depressive disorder, without psychotic features (HCC)  F33.2       Past Psychiatric History:   Past Medical History:  Past Medical History:  Diagnosis Date   ADHD (attention deficit hyperactivity disorder)    Anxiety    Depression    Oppositional defiant disorder     Past Surgical History:  Procedure Laterality Date   WISDOM TOOTH EXTRACTION Bilateral 2021    Family Psychiatric History:   Family History:  Family History  Problem Relation Age of Onset   Asthma Mother    Anxiety disorder Mother    Depression Mother     Social History:  Social History   Socioeconomic History   Marital status: Single    Spouse name: Not on file   Number of children: 0   Years of education: Not on file   Highest education level: 11th grade  Occupational History   Not on file  Tobacco Use   Smoking status: Never   Smokeless tobacco: Current  Vaping Use   Vaping Use: Every day  Substance and Sexual Activity   Alcohol use: Yes    Comment: occassional  Drug use: Yes    Types: Marijuana   Sexual activity: Not Currently    Birth control/protection: None    Comment: not sexually active x 1 month  Other Topics Concern   Not on file  Social History Narrative   Lives with mother.  Father and her mother recently separated.  Mother is pregnant with baby girl, due August 16.      03/15/21=Lives with boyfriend.   Social Determinants of Health   Financial Resource Strain: Not on file  Food Insecurity: Not on file  Transportation Needs: Not on file  Physical Activity: Not on file  Stress: Not on file  Social Connections: Not on file    Allergies:  No Known Allergies  Metabolic Disorder Labs: No results found for: HGBA1C, MPG No results found for: PROLACTIN No results found for: CHOL, TRIG, HDL, CHOLHDL, VLDL, LDLCALC Lab Results  Component Value Date   TSH 1.776 07/11/2014    Therapeutic Level Labs: No results found for: LITHIUM No results found for: VALPROATE No components found for:  CBMZ  Current Medications: Current Outpatient Medications  Medication Sig Dispense Refill   cyclobenzaprine (FLEXERIL) 10 MG tablet Take 1 tablet (10 mg total) by mouth 2 (two) times daily as needed for muscle spasms. (Patient not taking: Reported on 03/15/2021) 20 tablet 0   hydrOXYzine (ATARAX) 10 MG tablet Take 1 tablet (10 mg total) by mouth every 6 (six) hours as needed for anxiety. 30 tablet 0   QUEtiapine (SEROQUEL) 50 MG tablet Take 1 tablet (50 mg total) by mouth at bedtime. 30 tablet 1   No current facility-administered medications for this visit.     Musculoskeletal: Strength & Muscle Tone: within normal limits Gait & Station: normal Patient leans: N/A  Psychiatric Specialty Exam: Review of Systems  There were no vitals taken for this visit.There is no height or weight on file to calculate BMI.  General Appearance: Casual  Eye Contact:  Good  Speech:  Clear and Coherent  Volume:  Normal  Mood:  Anxious and Depressed  Affect:  Congruent  Thought Process:  Coherent  Orientation:  Full (Time, Place, and Person)  Thought Content: Logical   Suicidal Thoughts:  No  Homicidal Thoughts:  No  Memory:  Immediate;   Fair Recent;   Fair  Judgement:  Fair  Insight:  Good  Psychomotor Activity:  Normal  Concentration:  Concentration: Good  Recall:  Good  Fund of Knowledge: Good  Language: Good  Akathisia:  No  Handed:  Right  AIMS (if indicated): done  Assets:  Desire for Improvement Resilience  ADL's:  Intact  Cognition: WNL  Sleep:  Good   Screenings: GAD-7    Flowsheet Row Counselor from 01/26/2021 in [facility] Office Visit from 09/22/2020 in [facility] Office Visit from 08/31/2020 in [facility]  Total GAD-7 Score 9 6 18       PHQ2-9    Flowsheet Row Counselor from 03/15/2021 in [facility] Counselor from 03/09/2021 in [facility] Counselor from 01/26/2021 in [facility] Office Visit from 10/30/2020 in [facility] Office Visit from 09/22/2020 in [facility]  PHQ-2 Total Score 5 5 4 2 2   PHQ-9 Total Score 18 19 12 10 8       Flowsheet Row Counselor from 03/15/2021 in [facility] Counselor from 03/09/2021 in [facility] ED from 02/08/2021 in [facility] at [facility]   C-SSRS RISK CATEGORY High Risk No Risk No Risk        Assessment and Plan:  Continue Partial Hospitalization - [facility]  - New patient appointment for 03/17/2021 -  Treatment plan was reviewed and agreed upon by NP T.Melvyn Neth and patient Ellah Otte need for group services    Michaela Rack, NP 03/16/2021, 3:13 PM

## 2021-03-16 NOTE — Progress Notes (Unsigned)
Spoke with patient via Webex video call, used 2 identifiers to correctly identify patient. States this is her first time in [facility] as recommended by [facility] for depression/anxiety. Was feeling suicidal but had no plan or intent. Very emotional feeling frustrated and angry. 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 Feels tearful a lot. Recently got back together with her boyfriend whom she now lives with. Groups are going well so far. States that it feels good to talk to someone everyday. On scale 1-10 as 10 being worst she rates depression at 6 and anxiety at 8. Denies SI/HI or AV hallucinations. PHQ9=18. No issues or complaints. No side effects from medications.

## 2021-03-17 ENCOUNTER — Encounter (HOSPITAL_COMMUNITY): Payer: Medicaid Other | Admitting: Physician Assistant

## 2021-03-17 ENCOUNTER — Ambulatory Visit (INDEPENDENT_AMBULATORY_CARE_PROVIDER_SITE_OTHER): Payer: Medicaid Other | Admitting: Licensed Clinical Social Worker

## 2021-03-17 DIAGNOSIS — F3181 Bipolar II disorder: Secondary | ICD-10-CM

## 2021-03-17 DIAGNOSIS — F332 Major depressive disorder, recurrent severe without psychotic features: Secondary | ICD-10-CM

## 2021-03-18 ENCOUNTER — Ambulatory Visit (INDEPENDENT_AMBULATORY_CARE_PROVIDER_SITE_OTHER): Payer: Medicaid Other | Admitting: Licensed Clinical Social Worker

## 2021-03-18 DIAGNOSIS — F332 Major depressive disorder, recurrent severe without psychotic features: Secondary | ICD-10-CM | POA: Diagnosis not present

## 2021-03-18 DIAGNOSIS — F3181 Bipolar II disorder: Secondary | ICD-10-CM

## 2021-03-19 ENCOUNTER — Ambulatory Visit (INDEPENDENT_AMBULATORY_CARE_PROVIDER_SITE_OTHER): Payer: Medicaid Other | Admitting: Licensed Clinical Social Worker

## 2021-03-19 DIAGNOSIS — F332 Major depressive disorder, recurrent severe without psychotic features: Secondary | ICD-10-CM

## 2021-03-19 DIAGNOSIS — F3181 Bipolar II disorder: Secondary | ICD-10-CM

## 2021-03-22 ENCOUNTER — Ambulatory Visit (INDEPENDENT_AMBULATORY_CARE_PROVIDER_SITE_OTHER): Payer: Medicaid Other | Admitting: Licensed Clinical Social Worker

## 2021-03-22 DIAGNOSIS — F411 Generalized anxiety disorder: Secondary | ICD-10-CM

## 2021-03-22 DIAGNOSIS — F332 Major depressive disorder, recurrent severe without psychotic features: Secondary | ICD-10-CM

## 2021-03-22 NOTE — Progress Notes (Signed)
[name] received VM from [facility] [place] (ext [id number]) stating pt has been approved for 5 more days (2/13-2/17). Auth #: [id number]

## 2021-03-23 ENCOUNTER — Ambulatory Visit (INDEPENDENT_AMBULATORY_CARE_PROVIDER_SITE_OTHER): Payer: Medicaid Other | Admitting: Licensed Clinical Social Worker

## 2021-03-23 DIAGNOSIS — F332 Major depressive disorder, recurrent severe without psychotic features: Secondary | ICD-10-CM

## 2021-03-23 DIAGNOSIS — F411 Generalized anxiety disorder: Secondary | ICD-10-CM

## 2021-03-24 ENCOUNTER — Ambulatory Visit (INDEPENDENT_AMBULATORY_CARE_PROVIDER_SITE_OTHER): Payer: Medicaid Other | Admitting: Licensed Clinical Social Worker

## 2021-03-24 DIAGNOSIS — F332 Major depressive disorder, recurrent severe without psychotic features: Secondary | ICD-10-CM

## 2021-03-24 DIAGNOSIS — F411 Generalized anxiety disorder: Secondary | ICD-10-CM

## 2021-03-24 NOTE — Progress Notes (Signed)
Spoke with patient via Webex video call, used 2 identifiers to correctly identify patient States that groups are going well and she enjoys them. Broke up with her boyfriend  since we last spoke and he decided he wants to move back in with his mother. She is now forced to move in with her mom and sister in a 2 bedroom trailer. She normally would be suicidal and devastated but she states that she cried but never felt suicidal. She has learned coping skills in group and feels much stronger. Has a lot of pride for how she handled the situation. Denies SI/HI or AV hallucinations. On scale 1-10 as 10 being worst she rates depression at 2 and anxiety at 6. No issue or complaints.

## 2021-03-25 ENCOUNTER — Ambulatory Visit (INDEPENDENT_AMBULATORY_CARE_PROVIDER_SITE_OTHER): Payer: Medicaid Other | Admitting: Licensed Clinical Social Worker

## 2021-03-25 DIAGNOSIS — F411 Generalized anxiety disorder: Secondary | ICD-10-CM

## 2021-03-25 DIAGNOSIS — F332 Major depressive disorder, recurrent severe without psychotic features: Secondary | ICD-10-CM

## 2021-03-25 NOTE — Psych (Signed)
Virtual Visit via Video Note  I connected with Michaela Jennings on 03/23/21 at  9:00 AM EST by a video enabled telemedicine application and verified that I am speaking with the correct person using two identifiers.  Location: Patient: patient home Provider: clinical home office   I discussed the limitations of evaluation and management by telemedicine and the availability of in person appointments. The patient expressed understanding and agreed to proceed.  I discussed the assessment and treatment plan with the patient. The patient was provided an opportunity to ask questions and all were answered. The patient agreed with the plan and demonstrated an understanding of the instructions.   The patient was advised to call back or seek an in-person evaluation if the symptoms worsen or if the condition fails to improve as anticipated.  I provided 240 minutes of non-face-to-face time during this encounter.   Donia Guiles, LCSW   [facility] PHP THERAPIST PROGRESS NOTE  Lynley Killilea [id number]  Session Time: 9:00 - 10:00  Participation Level: Active  Behavioral Response: CasualAlertDepressed  Type of Therapy: Group Therapy  Treatment Goals addressed: Coping  Progress Towards Goals: Progressing  Interventions: CBT, DBT, Supportive, and Reframing  Summary: Clinician led check-in regarding current stressors and situation. Clinician utilized active listening and empathetic response and validated patient emotions. Clinician facilitated processing group on pertinent issues.   Therapist Response: Michaela Jennings is a 21 y.o. female who presents with depression and anxiety symptoms. Patient arrived within time allowed and reports that she is feeling anxious." Patient rates her mood at a 6 on a scale of 1-10 with 10 being great. Pt reports experiencing nausea and increased heart rate which she connects to anxiety. Pt states she is unsure why she is feeling this way. Pt able to process. Pt engaged  in discussion.       Session Time: 10:00 - 11:00   Participation Level: Active   Behavioral Response: CasualAlertDepressed   Type of Therapy: Group Therapy   Treatment Goals addressed: Coping   Interventions: CBT, DBT, Supportive and Reframing   Summary: [name] led discussion on "work arounds" or finding intermediate solutions while we are in the process of working on a final solution. Group members shared situations in which they are struggling with an issue that they are unable to fix quickly. [name] encouraged pt's to consider ways to make things "doable" and not let "perfect" stand in the way.     Therapist Response:  Pt engaged and is able to connect with discussion.       Session Time: 11:00- 12:00   Participation Level: Active   Behavioral Response: CasualAlertDepressed   Type of Therapy: Group Therapy   Treatment Goals addressed: Coping   Interventions: CBT, DBT, Supportive and Reframing   Summary: [name] introduced DBT emotion regulation skill, Opposite Action. [name] discussed how imagining the opposite reaction and acting in that manner can help keep [place] regulated and limit negative consequences. Group discussed ways they struggle with managing reactions.    Therapist Response:  Pt engaged in discussion and is able to identify ways to utilize opposite action.        Session Time: 12:00- 1:00   Participation Level: Active   Behavioral Response: CasualAlertDepressed   Type of Therapy: Group Therapy   Treatment Goals addressed: Coping   Interventions: CBT, DBT, Supportive and Reframing   Summary: 12:00-12:50: [name] continued topic of DBT distress tolerance skills. [name] introduced TIPP skills to use during cases of extreme emotion. Group discussed which situations they  can apply TIPP skills.  12:50 -1:00 Clinician led check-out. Clinician assessed for immediate needs, medication compliance and efficacy, and safety concerns   Therapist Response: 12:00-12:50 Pt enaged in  discussion and discussed how to apply TIPP. 12:50 - 1:00: At check-out, patient rates her mood at a 8 on a scale of 1-10 with 10 being great. Pt reports afternoon plans of packing. Pt demonstrates some progress as evidenced by making active effort to cope. Patient denies SI/HI at the end of group.     Suicidal/Homicidal: Nowithout intent/plan  Plan: Pt will continue in PHP while working to decrease depression and anxiety symptoms, increase emotion regulation, and increase ability to manage symptoms in a healthy manner.   Collaboration of Care: Medication Management AEB TMelvyn Neth  Patient/Guardian was advised Release of Information must be obtained prior to any record release in order to collaborate their care with an outside provider. Patient/Guardian was advised if they have not already done so to contact the registration department to sign all necessary forms in order for [place] to release information regarding their care.   Consent: Patient/Guardian gives verbal consent for treatment and assignment of benefits for services provided during this visit. Patient/Guardian expressed understanding and agreed to proceed.   Diagnosis: Severe episode of recurrent major depressive disorder, without psychotic features (HCC) [F33.2]    1. Severe episode of recurrent major depressive disorder, without psychotic features (HCC)   2. Generalized anxiety disorder       Donia Guiles, [place] 03/25/2021

## 2021-03-25 NOTE — Progress Notes (Addendum)
I reviewed chart and agreed with the findings and treatment Plan of the patient.  Kathryne Sharper, MD   Virtual Visit via Video Note  I connected with Michaela Jennings on 03/25/21 at  9:00 AM EST by a video enabled telemedicine application and verified that I am speaking with the correct person using two identifiers.  Location: Patient: Home Provider: Office   I discussed the limitations of evaluation and management by telemedicine and the availability of in person appointments. The patient expressed understanding and agreed to proceed.    I discussed the assessment and treatment plan with the patient. The patient was provided an opportunity to ask questions and all were answered. The patient agreed with the plan and demonstrated an understanding of the instructions.   The patient was advised to call back or seek an in-person evaluation if the symptoms worsen or if the condition fails to improve as anticipated.  I provided 15 minutes of non-face-to-face time during this encounter.   Oneta Rack, NP   [facility] Intensive Outpatient Program Discharge Summary  Michaela Jennings [id number]  Admission date: 03/11/2021 Discharge date: 03/26/2021  Reason for admission: Per admission assessment note- "Michaela Jennings is a 21 year old female that presents with worsening depression and anxiety.  States ' I needed some extra help".  Denied previous inpatient admissions.  Denies previous suicide attempts plan or intent.  States she is currently prescribed Seroquel hydroxyzine for depression, anxiety oppositional defiant disorder and attention deficit disorder.  States unsure of her recent diagnosis with bipolar disorder.  However states it was alluded to in her visit to her primary care provider.  She reports a recent break-up with the boyfriend and was hopeful about and can get back together again and they reside together in the home.  Reports a good appetite.  States she is resting well  throughout the night."   Chemical Use History: Reported she has cut back on marijuana use.  Denies alcohol or any other illicit drug use history.   Family of Origin Issues: Michaela Jennings reports she and her mother has reestablished a relationship as she has plans to reside with her after recent break-up by she and her boyfriend.  Progress in Program Toward Treatment Goals: Progressing- Qunisha attended and participated with daily group session when active and engaged participation.  Denying suicidal or homicidal ideations.  Denies auditory or visual hallucinations.  Denied any medication refills at this time   progress (rationale): Keep follow-up appointment with individual therapist  Take all medications as prescribed. Keep all follow-up appointments as scheduled.  Do not consume alcohol or use illegal drugs while on prescription medications. Report any adverse effects from your medications to your primary care provider promptly.  In the event of recurrent symptoms or worsening symptoms, call 911, a crisis hotline, or go to the nearest emergency department for evaluation.    Hillery Jacks NP  03/25/2021

## 2021-03-25 NOTE — Psych (Signed)
Virtual Visit via Video Note  I connected with Michaela Jennings on 03/22/21 at  9:00 AM EST by a video enabled telemedicine application and verified that I am speaking with the correct person using two identifiers.  Location: Patient: patient home Provider: clinical home office   I discussed the limitations of evaluation and management by telemedicine and the availability of in person appointments. The patient expressed understanding and agreed to proceed.  I discussed the assessment and treatment plan with the patient. The patient was provided an opportunity to ask questions and all were answered. The patient agreed with the plan and demonstrated an understanding of the instructions.   The patient was advised to call back or seek an in-person evaluation if the symptoms worsen or if the condition fails to improve as anticipated.  I provided 240 minutes of non-face-to-face time during this encounter.   Lorin Glass, LCSW   [facility] PHP THERAPIST PROGRESS NOTE  [id number]  Session Time: 9:00 - 10:00  Participation Level: Active  Behavioral Response: CasualAlertDepressed  Type of Therapy: Group Therapy  Treatment Goals addressed: Coping  Progress Towards Goals: Progressing  Interventions: CBT, DBT, Supportive, and Reframing  Summary: Michaela Jennings is a 21 y.o. female who presents with depression and anxiety symptoms.  Clinician led check-in regarding current stressors and situation. Clinician utilized active listening and empathetic response and validated patient emotions. Clinician facilitated processing group on pertinent issues.   Therapist Response: Patient arrived within time allowed and reports that she is feeling anxious." Patient rates her mood at a 5 on a scale of 1-10 with 10 being great. Pt states her weekend was "eventful" and ended with her having to move out of her shared apartment. Pt states she will move in with her mother. Pt reports drinking  alcohol with friends and calling out of work due to a hangover the next morning. Pt able to process. Pt engaged in discussion.       Session Time: 10:00 - 11:00   Participation Level: Active   Behavioral Response: CasualAlertDepressed   Type of Therapy: Group Therapy   Treatment Goals addressed: Coping   Interventions: CBT, DBT, Supportive and Reframing   Summary: [name] facilitated processing group around difficult relationships. Group members shared struggles they face in relationships. [name] brought in topics of self-esteem, CBT thought challenging, boundaries, and communication to aid growth.    Therapist Response:  Pt engaged in discussion and is able to process.         Session Time: 11:00- 12:00   Participation Level: Active   Behavioral Response: CasualAlertDepressed   Type of Therapy: Group Therapy   Treatment Goals addressed: Coping   Interventions: CBT, DBT, Supportive and Reframing   Summary: [name] led discussion on healthy relationships. Group members shared issues they have experienced in past relationships and in identifying what "healthy" looks like. [name] discussed respect, trust, and honesty as non-negotiable traits in a healthy dynamic. Group shared and problem solved barriers to recognizing and priortizing these traits.    Therapist Response:  Pt engaged in discussion and is able to process.         Session Time: 12:00- 1:00   Participation Level: Active   Behavioral Response: CasualAlertDepressed   Type of Therapy: Group Therapy   Treatment Goals addressed: Coping   Interventions: CBT, DBT, Supportive and Reframing   Summary: 12:00-12:50: [name] led discussion on protecting our energy. [name] brought in topics of boundaries and self-esteem. Group discussed what drains them and why they  engage in those activities. [name] encouraged pt's to consider what will protect their energy when making decisions.  12:50 -1:00 Clinician led check-out. Clinician assessed for  immediate needs, medication compliance and efficacy, and safety concerns   Therapist Response: 12:00-12:50 Pt enaged in discussion.  12:50 - 1:00: At check-out, patient rates her mood at a 7 on a scale of 1-10 with 10 being great. Pt reports afternoon plans of working. Pt demonstrates some progress as evidenced by utilizing skills when needed. Patient denies SI/HI at the end of group.     Suicidal/Homicidal: Nowithout intent/plan  Plan: Pt will continue in PHP while working to decrease depression and anxiety symptoms, increase emotion regulation, and increase ability to manage symptoms in a healthy manner.   Collaboration of Care: Medication Management AEB TBobby Rumpf  Patient/Guardian was advised Release of Information must be obtained prior to any record release in order to collaborate their care with an outside provider. Patient/Guardian was advised if they have not already done so to contact the registration department to sign all necessary forms in order for [place] to release information regarding their care.   Consent: Patient/Guardian gives verbal consent for treatment and assignment of benefits for services provided during this visit. Patient/Guardian expressed understanding and agreed to proceed.   Diagnosis: Severe episode of recurrent major depressive disorder, without psychotic features ([place]) [F33.2]    1. Severe episode of recurrent major depressive disorder, without psychotic features ([place])   2. Generalized anxiety disorder       Lorin Glass, [place] 03/25/2021

## 2021-03-25 NOTE — Progress Notes (Incomplete Revision)
I reviewed chart and agreed with the findings and treatment Plan of the patient. ° °Michaela Padin, MD  ° °Virtual Visit via Video Note ° °I connected with Tiaja Nordin on 03/25/21 at  9:00 AM EST by a video enabled telemedicine application and verified that I am speaking with the correct person using two identifiers. ° °Location: °Patient: Home °Provider: Office °  °I discussed the limitations of evaluation and management by telemedicine and the availability of in person appointments. The patient expressed understanding and agreed to proceed. ° °  °I discussed the assessment and treatment plan with the patient. The patient was provided an opportunity to ask questions and all were answered. The patient agreed with the plan and demonstrated an understanding of the instructions. °  °The patient was advised to call back or seek an in-person evaluation if the symptoms worsen or if the condition fails to improve as anticipated. ° °I provided 15 minutes of non-face-to-face time during this encounter. ° ° °Tanika N Lewis, NP ° ° °[facility] °Intensive Outpatient Program °Discharge Summary ° °Raiya Sublett °[id number] ° °Admission date: 03/11/2021 °Discharge date: 03/26/2021 ° °Reason for admission: Per admission assessment note- "Michaela Jennings is a 21-year-old female that presents with worsening depression and anxiety.  States ' I needed some extra help".  Denied previous inpatient admissions.  Denies previous suicide attempts plan or intent.  States she is currently prescribed Seroquel hydroxyzine for depression, anxiety oppositional defiant disorder and attention deficit disorder.  States unsure of her recent diagnosis with bipolar disorder.  However states it was alluded to in her visit to her primary care provider.  She reports a recent break-up with the boyfriend and was hopeful about and can get back together again and they reside together in the home.  Reports a good appetite.  States she is resting well  throughout the night."  ° °Chemical Use History: Reported she has cut back on marijuana use.  Denies alcohol or any other illicit drug use history. ° ° °Family of Origin Issues: Hawley reports she and her mother has reestablished a relationship as she has plans to reside with her after recent break-up by she and her boyfriend. ° °Progress in Program Toward Treatment Goals: Progressing- °Jameia attended and participated with daily group session when active and engaged participation.  Denying suicidal or homicidal ideations.  Denies auditory or visual hallucinations.  Denied any medication refills at this time  ° °progress (rationale): Keep follow-up appointment with individual therapist ° °Take all medications as prescribed. °Keep all follow-up appointments as scheduled.  °Do not consume alcohol or use illegal drugs while on prescription medications. °Report any adverse effects from your medications to your primary care provider promptly.  °In the event of recurrent symptoms or worsening symptoms, call 911, a crisis hotline, or go to the nearest emergency department for evaluation.   ° °Tanika Lewis NP  °03/25/2021 °

## 2021-03-26 ENCOUNTER — Ambulatory Visit (HOSPITAL_COMMUNITY): Payer: Self-pay

## 2021-03-29 ENCOUNTER — Telehealth (HOSPITAL_COMMUNITY): Payer: Self-pay | Admitting: Licensed Clinical Social Worker

## 2021-03-29 ENCOUNTER — Ambulatory Visit (HOSPITAL_COMMUNITY): Payer: Self-pay

## 2021-03-30 ENCOUNTER — Ambulatory Visit (HOSPITAL_COMMUNITY): Payer: Medicaid Other | Admitting: Professional

## 2021-03-30 ENCOUNTER — Ambulatory Visit (HOSPITAL_COMMUNITY): Payer: Self-pay

## 2021-03-30 DIAGNOSIS — F332 Major depressive disorder, recurrent severe without psychotic features: Secondary | ICD-10-CM

## 2021-03-30 DIAGNOSIS — F411 Generalized anxiety disorder: Secondary | ICD-10-CM

## 2021-03-30 NOTE — Progress Notes (Signed)
[name] contacted Corena Pilgrim with [employer] [phone] ext. [phone]) to request a date extension since pt missed two days of group. [name] left VM detailing same. [phone] ext. [phone]

## 2021-04-08 ENCOUNTER — Encounter (HOSPITAL_COMMUNITY): Payer: Medicaid Other | Admitting: Physician Assistant

## 2021-04-13 NOTE — Psych (Signed)
Virtual Visit via Video Note  I connected with Michaela Jennings on 03/30/21 at  9:00 AM EST by a video enabled telemedicine application and verified that I am speaking with the correct person using two identifiers.  Location: Patient: patient home Provider: clinical home office   I discussed the limitations of evaluation and management by telemedicine and the availability of in person appointments. The patient expressed understanding and agreed to proceed.  I discussed the assessment and treatment plan with the patient. The patient was provided an opportunity to ask questions and all were answered. The patient agreed with the plan and demonstrated an understanding of the instructions.   The patient was advised to call back or seek an in-person evaluation if the symptoms worsen or if the condition fails to improve as anticipated.  I provided 180 minutes of non-face-to-face time during this encounter.   Royetta Crochet, [facility]   [facility] PHP THERAPIST PROGRESS NOTE  Michaela Jennings [id number]  Session Time: 9:00 - 10:00  Participation Level: Active  Behavioral Response: CasualAlertDepressed  Type of Therapy: Group Therapy  Treatment Goals addressed: Coping  Progress Towards Goals: Progressing  Interventions: CBT, DBT, Supportive, and Reframing  Summary: Clinician led check-in regarding current stressors and situation. Clinician utilized active listening and empathetic response and validated patient emotions. Clinician facilitated processing group on pertinent issues.   Therapist Response: Michaela Jennings is a 21 y.o. female who presents with depression and anxiety symptoms. Patient arrived within time allowed and reports that she feels "good. Patient rates her mood at a 5 on a scale of 1-10 with 10 being best. Pt reports she did not have a good day yesterday. Pt reports she is struggling due to moving back in with her mother. Pt able to process. Pt engaged in discussion.      Session Time: 10-11   Participation Level: Minimal   Behavioral Response: CasualAlertAnxious and Depressed   Type of Therapy: Group Therapy   Treatment Goals addressed: Coping   Progress Towards Goals: Progressing   Interventions: CBT, DBT, Solution Focused, Strength-based, Supportive, and Reframing   Summary: [name] led group on Radical Acceptance. Pt's discussed areas of life where RA could be beneficial to use.    Therapist Response: Pt engaged in discussion. Pt reports she understands that RA could help her with her thinking about living with her mother but pt declines to engage in the process.     Session Time: 11-12   Participation Level: Minimal   Behavioral Response: CasualAlertAnxious and Depressed   Type of Therapy: Group Therapy   Treatment Goals addressed: Coping   Progress Towards Goals: Progressing   Interventions: CBT, DBT, Solution Focused, Strength-based, Supportive, and Reframing   Summary: Clinician introduced topic of "Values". Group discussed the importance of understanding and re-evaluating values throughout life. Group participated in a [employer] activity to help identify what values patients hold.    Therapist Response: Pt engaged in discussion when prompted. Pt reports that health and wellbeing is a value she holds.      Session Time: 12-1   Participation Level: None   Behavioral Response: CasualAlertAnxious and Depressed   Type of Therapy: Group Therapy   Treatment Goals addressed: Coping   Progress Towards Goals: Progressing   Interventions: CBT, DBT, Solution Focused, Strength-based, Supportive, and Reframing   Summary: 12-12:50: Clinician continued topic of "values. Group discussed how to evaluate current values and how values related to goals. Group completed a values worksheet to help.  12:50 -1:00 Clinician led  check-out. Clinician assessed for immediate needs, medication compliance and efficacy, and safety concerns     Therapist Response: 12-12:50: Pt was not on camera and did not respond when prompted. 12:50-1:00: Pt did not do check out. Pt was contacted without success.     Suicidal/Homicidal: Nowithout intent/plan  Plan: Pt will continue in PHP while working to decrease depression and anxiety symptoms, increase emotion regulation, and increase ability to manage symptoms in a healthy manner.   Collaboration of Care: Other none  Patient/Guardian was advised Release of Information must be obtained prior to any record release in order to collaborate their care with an outside provider. Patient/Guardian was advised if they have not already done so to contact the registration department to sign all necessary forms in order for [place] to release information regarding their care.   Consent: Patient/Guardian gives verbal consent for treatment and assignment of benefits for services provided during this visit. Patient/Guardian expressed understanding and agreed to proceed.   Diagnosis: Severe episode of recurrent major depressive disorder, without psychotic features ([place]) [F33.2]    1. Severe episode of recurrent major depressive disorder, without psychotic features ([place])   2. Generalized anxiety disorder       Royetta Crochet, [facility] 03/30/2021

## 2021-04-29 ENCOUNTER — Ambulatory Visit (HOSPITAL_COMMUNITY): Payer: Medicaid Other | Admitting: Licensed Clinical Social Worker

## 2021-04-29 ENCOUNTER — Encounter (HOSPITAL_COMMUNITY): Payer: Self-pay

## 2021-04-29 ENCOUNTER — Telehealth (HOSPITAL_COMMUNITY): Payer: Self-pay | Admitting: Licensed Clinical Social Worker

## 2021-04-29 NOTE — Telephone Encounter (Signed)
LCSW sent two links to pt phone with no response. LCSW f/u with a PC, and there was no answer. Pt mailbox was full and no VM was left.  ?

## 2021-06-29 DIAGNOSIS — H5213 Myopia, bilateral: Secondary | ICD-10-CM | POA: Diagnosis not present

## 2021-07-13 ENCOUNTER — Encounter: Payer: Self-pay | Admitting: Family Medicine

## 2021-09-13 ENCOUNTER — Ambulatory Visit
Admission: EM | Admit: 2021-09-13 | Discharge: 2021-09-13 | Disposition: A | Discharge status: Home / Self Care | Payer: Medicaid Other | Attending: Internal Medicine | Admitting: Internal Medicine

## 2021-09-13 ENCOUNTER — Encounter: Payer: Self-pay | Admitting: Emergency Medicine

## 2021-09-13 DIAGNOSIS — N946 Dysmenorrhea, unspecified: Secondary | ICD-10-CM

## 2021-09-13 LAB — POCT URINE PREGNANCY: Preg Test, Ur: NEGATIVE

## 2021-09-13 MED ORDER — NAPROXEN 500 MG PO TABS
500.0000 mg | ORAL_TABLET | Freq: Two times a day (BID) | ORAL | 0 refills | Status: AC | PRN
Start: 2021-09-13 — End: ?

## 2021-09-13 NOTE — Discharge Instructions (Signed)
Your urine pregnancy test was negative.  You have been prescribed naproxen to take for menstrual cramps.  Recommend that you follow-up with gynecologist at provided contact information as well given recurrent intense menstrual cramps.  Please follow-up if symptoms persist or worsen.

## 2021-09-13 NOTE — ED Triage Notes (Signed)
Pt is present today with period cramps. Pt states she started her period friday and her cramps have become intense since then. Pt states that she takes OTC medication can help at times

## 2021-09-13 NOTE — ED Provider Notes (Signed)
[facility]    CSN: [id number] Arrival date & time: 09/13/21  0859      History   Chief Complaint Chief Complaint  Patient presents with   Abdominal Pain    HPI Michaela Jennings is a 21 y.o. female.   Patient presents with severe menstrual cramps.  Patient reports that her menstrual cycle started about 4 days ago on 09/10/2021.  She has been having regular menstrual cycles.  She reports that she typically has intense menstrual cramps about every 4 cycles.  She has been seen previously and prescribed naproxen with some improvement.  Has taken tylenol at home with minimal improvement. Patient reports that her menstrual bleeding has been fairly normal for her, but she did have some vaginal spotting prior to menstrual cycle starting which is not normal.  Denies any vaginal discharge, urinary symptoms, back pain, fever.  She has had unprotected sexual intercourse recently.  She has never been seen by gynecology before.  She does not take birth control.  She states that she no longer takes any daily medications as well but does have history of anxiety and depression.   Abdominal Pain   Past Medical History:  Diagnosis Date   ADHD (attention deficit hyperactivity disorder)    Anxiety    Depression    Oppositional defiant disorder     Patient Active Problem List   Diagnosis Date Noted   Major depressive disorder, recurrent episode, severe (HCC) 03/09/2021   Generalized anxiety disorder 03/09/2021   Bipolar 2 disorder, major depressive episode (HCC) 09/22/2020   Bipolar 1 disorder, depressed (HCC) 09/15/2020   Depression with anxiety 05/08/2018   Encounter for initial prescription of contraceptive pills 01/02/2017   Rash and nonspecific skin eruption 06/22/2013   Oppositional defiant disorder 05/22/2010   ATTENTION DEFICIT DISORDER 12/18/2009   DISRUPTIVE BEHAVIOR DISORDER 01/15/2009   REFRACTIVE AMBLYOPIA 07/26/2007    Past Surgical History:  Procedure Laterality Date    WISDOM TOOTH EXTRACTION Bilateral 2021    OB History   No obstetric history on file.      Home Medications    Prior to Admission medications   Medication Sig Start Date End Date Taking? Authorizing Provider  naproxen (NAPROSYN) 500 MG tablet Take 1 tablet (500 mg total) by mouth 2 (two) times daily as needed. 09/13/21  Yes , Acie Fredrickson, FNP  cyclobenzaprine (FLEXERIL) 10 MG tablet Take 1 tablet (10 mg total) by mouth 2 (two) times daily as needed for muscle spasms. Patient not taking: Reported on 03/15/2021 02/08/21   Tomi Bamberger, PA-C  hydrOXYzine (ATARAX) 10 MG tablet Take 1 tablet (10 mg total) by mouth every 6 (six) hours as needed for anxiety. 02/01/21   White, Patrice L, NP  QUEtiapine (SEROQUEL) 50 MG tablet Take 1 tablet (50 mg total) by mouth at bedtime. 01/26/21   Nwoko, Tommas Olp, PA  levocetirizine (XYZAL) 5 MG tablet Take 1 tablet (5 mg total) by mouth every evening. Patient not taking: Reported on 03/17/2019 12/16/18 04/18/20  Wallis Bamberg, PA-C  sertraline (ZOLOFT) 25 MG tablet Take 3 tablets (75 mg total) by mouth daily. Patient not taking: Reported on 03/17/2019 07/09/18 04/18/20  Shon Hale, MD  SPRINTEC 28 0.25-35 MG-MCG tablet TAKE 1 TABLET BY MOUTH EVERY DAY Patient not taking: No sig reported 03/05/18 04/18/20  Howard Pouch, MD    Family History Family History  Problem Relation Age of Onset   Asthma Mother    Anxiety disorder Mother    Depression Mother  Social History Social History   Tobacco Use   Smoking status: Never   Smokeless tobacco: Current  Vaping Use   Vaping Use: Every day  Substance Use Topics   Alcohol use: Yes    Comment: occassional   Drug use: Yes    Types: Marijuana     Allergies   Patient has no known allergies.   Review of Systems Review of Systems Per HPI  Physical Exam Triage Vital Signs ED Triage Vitals  Enc Vitals Group     BP 09/13/21 0947 117/80     Pulse Rate 09/13/21 0947 65     Resp 09/13/21 0947  17     Temp 09/13/21 0947 97.8 F (36.6 C)     Temp src --      SpO2 09/13/21 0947 99 %     Weight --      Height --      Head Circumference --      Peak Flow --      Pain Score 09/13/21 0946 5     Pain Loc --      Pain Edu? --      Excl. in GC? --    No data found.  Updated Vital Signs BP 117/80   Pulse 65   Temp 97.8 F (36.6 C)   Resp 17   LMP 09/10/2021   SpO2 99%   Visual Acuity Right Eye Distance:   Left Eye Distance:   Bilateral Distance:    Right Eye Near:   Left Eye Near:    Bilateral Near:     Physical Exam Constitutional:      General: She is not in acute distress.    Appearance: Normal appearance. She is not toxic-appearing or diaphoretic.  HENT:     Head: Normocephalic and atraumatic.  Eyes:     Extraocular Movements: Extraocular movements intact.     Conjunctiva/sclera: Conjunctivae normal.  Cardiovascular:     Rate and Rhythm: Normal rate and regular rhythm.     Pulses: Normal pulses.     Heart sounds: Normal heart sounds.  Pulmonary:     Effort: Pulmonary effort is normal. No respiratory distress.     Breath sounds: Normal breath sounds.  Abdominal:     General: Bowel sounds are normal. There is no distension.     Palpations: Abdomen is soft.     Tenderness: There is no abdominal tenderness.  Neurological:     General: No focal deficit present.     Mental Status: She is alert and oriented to person, place, and time. Mental status is at baseline.  Psychiatric:        Mood and Affect: Mood normal.        Behavior: Behavior normal.        Thought Content: Thought content normal.        Judgment: Judgment normal.      UC Treatments / Results  Labs (all labs ordered are listed, but only abnormal results are displayed) Labs Reviewed  POCT URINE PREGNANCY    EKG   Radiology No results found.  Procedures Procedures (including critical care time)  Medications Ordered in UC Medications - No data to display  Initial Impression  / Assessment and Plan / UC Course  I have reviewed the triage vital signs and the nursing notes.  Pertinent labs & imaging results that were available during my care of the patient were reviewed by me and considered in my medical decision making (see chart  for details).     Urine pregnancy test was negative.  Patient having dysmenorrhea which has been documented for her in the past.  Will treat with naproxen as needed.  Do not think any additional evaluation is necessary given patient has history and there is no significant bleeding. Encouraged patient to follow-up with gynecology at provided contact information for further evaluation and management given that this is a recurrent issue.  Discussed return and ER precautions.  Patient verbalized understanding and was agreeable with plan. Final Clinical Impressions(s) / UC Diagnoses   Final diagnoses:  Dysmenorrhea     Discharge Instructions      Your urine pregnancy test was negative.  You have been prescribed naproxen to take for menstrual cramps.  Recommend that you follow-up with gynecologist at provided contact information as well given recurrent intense menstrual cramps.  Please follow-up if symptoms persist or worsen.    ED Prescriptions     Medication Sig Dispense Auth. Provider   naproxen (NAPROSYN) 500 MG tablet Take 1 tablet (500 mg total) by mouth 2 (two) times daily as needed. 30 tablet [place], Acie Fredrickson, [place]      PDMP not reviewed this encounter.   Gustavus Bryant, [place] 09/13/21 1011

## 2021-10-16 ENCOUNTER — Encounter: Payer: Self-pay | Admitting: Emergency Medicine

## 2021-10-16 ENCOUNTER — Ambulatory Visit
Admission: EM | Admit: 2021-10-16 | Discharge: 2021-10-16 | Disposition: A | Discharge status: Home / Self Care | Payer: Medicaid Other | Attending: Internal Medicine | Admitting: Internal Medicine

## 2021-10-16 DIAGNOSIS — Z20822 Contact with and (suspected) exposure to covid-19: Secondary | ICD-10-CM | POA: Insufficient documentation

## 2021-10-16 DIAGNOSIS — J069 Acute upper respiratory infection, unspecified: Secondary | ICD-10-CM | POA: Insufficient documentation

## 2021-10-16 LAB — SARS CORONAVIRUS 2 BY RT PCR: SARS Coronavirus 2 by RT PCR: NEGATIVE

## 2021-10-16 MED ORDER — FLUTICASONE PROPIONATE 50 MCG/ACT NA SUSP: 1.0000 | Freq: Every day | NASAL | 0 refills | Status: AC

## 2021-10-16 MED ORDER — CETIRIZINE HCL 10 MG PO TABS: 10.0000 mg | ORAL_TABLET | Freq: Every day | ORAL | 0 refills | Status: AC

## 2021-10-16 NOTE — ED Triage Notes (Signed)
Pt is present today with sinus pressure, nasal congestion, and slight cough,. Pt sx started one week ago

## 2021-10-16 NOTE — Discharge Instructions (Signed)
It appears that you have a viral upper respiratory infection that should run its course and self resolve with symptomatic treatment.  Your COVID test is pending.  We will call if it is positive.  You have been prescribed medications to halp alleviate symptoms.  Please follow-up if symptoms persist or worsen.

## 2021-10-16 NOTE — ED Provider Notes (Signed)
[facility]    CSN: [id number] Arrival date & time: 10/16/21  1158      History   Chief Complaint Chief Complaint  Patient presents with   Nasal Congestion   Facial Pain    HPI Michaela Jennings is a 21 y.o. female.   Patient presents with nasal congestion and cough that started about 3 days ago.  Patient reports that her family member has similar symptoms.  Denies any known fevers at home.  Denies chest pain, shortness of breath, nausea, vomiting, diarrhea, abdominal pain.  Patient has not taken any medications to help alleviate symptoms.     Past Medical History:  Diagnosis Date   ADHD (attention deficit hyperactivity disorder)    Anxiety    Depression    Oppositional defiant disorder     Patient Active Problem List   Diagnosis Date Noted   Major depressive disorder, recurrent episode, severe (HCC) 03/09/2021   Generalized anxiety disorder 03/09/2021   Bipolar 2 disorder, major depressive episode (HCC) 09/22/2020   Bipolar 1 disorder, depressed (HCC) 09/15/2020   Depression with anxiety 05/08/2018   Encounter for initial prescription of contraceptive pills 01/02/2017   Rash and nonspecific skin eruption 06/22/2013   Oppositional defiant disorder 05/22/2010   ATTENTION DEFICIT DISORDER 12/18/2009   DISRUPTIVE BEHAVIOR DISORDER 01/15/2009   REFRACTIVE AMBLYOPIA 07/26/2007    Past Surgical History:  Procedure Laterality Date   WISDOM TOOTH EXTRACTION Bilateral 2021    OB History   No obstetric history on file.      Home Medications    Prior to Admission medications   Medication Sig Start Date End Date Taking? Authorizing Provider  cetirizine (ZYRTEC) 10 MG tablet Take 1 tablet (10 mg total) by mouth daily. 10/16/21  Yes , Acie Fredrickson, FNP  fluticasone (FLONASE) 50 MCG/ACT nasal spray Place 1 spray into both nostrils daily for 3 days. 10/16/21 10/19/21 Yes , Acie Fredrickson, FNP  cyclobenzaprine (FLEXERIL) 10 MG tablet Take 1 tablet (10 mg total) by  mouth 2 (two) times daily as needed for muscle spasms. Patient not taking: Reported on 03/15/2021 02/08/21   Tomi Bamberger, PA-C  hydrOXYzine (ATARAX) 10 MG tablet Take 1 tablet (10 mg total) by mouth every 6 (six) hours as needed for anxiety. 02/01/21   White, Patrice L, NP  naproxen (NAPROSYN) 500 MG tablet Take 1 tablet (500 mg total) by mouth 2 (two) times daily as needed. 09/13/21   Gustavus Bryant, FNP  QUEtiapine (SEROQUEL) 50 MG tablet Take 1 tablet (50 mg total) by mouth at bedtime. 01/26/21   Nwoko, Tommas Olp, PA  levocetirizine (XYZAL) 5 MG tablet Take 1 tablet (5 mg total) by mouth every evening. Patient not taking: Reported on 03/17/2019 12/16/18 04/18/20  Wallis Bamberg, PA-C  sertraline (ZOLOFT) 25 MG tablet Take 3 tablets (75 mg total) by mouth daily. Patient not taking: Reported on 03/17/2019 07/09/18 04/18/20  Shon Hale, MD  SPRINTEC 28 0.25-35 MG-MCG tablet TAKE 1 TABLET BY MOUTH EVERY DAY Patient not taking: No sig reported 03/05/18 04/18/20  Howard Pouch, MD    Family History Family History  Problem Relation Age of Onset   Asthma Mother    Anxiety disorder Mother    Depression Mother     Social History Social History   Tobacco Use   Smoking status: Never   Smokeless tobacco: Current  Vaping Use   Vaping Use: Every day  Substance Use Topics   Alcohol use: Yes    Comment: occassional  Drug use: Yes    Types: Marijuana     Allergies   Patient has no known allergies.   Review of Systems Review of Systems Per HPI  Physical Exam Triage Vital Signs ED Triage Vitals  Enc Vitals Group     BP 10/16/21 1237 106/69     Pulse Rate 10/16/21 1237 63     Resp 10/16/21 1237 17     Temp 10/16/21 1237 97.9 F (36.6 C)     Temp src --      SpO2 10/16/21 1237 99 %     Weight --      Height --      Head Circumference --      Peak Flow --      Pain Score 10/16/21 1238 0     Pain Loc --      Pain Edu? --      Excl. in GC? --    No data found.  Updated Vital  Signs BP 106/69   Pulse 63   Temp 97.9 F (36.6 C)   Resp 17   SpO2 99%   Visual Acuity Right Eye Distance:   Left Eye Distance:   Bilateral Distance:    Right Eye Near:   Left Eye Near:    Bilateral Near:     Physical Exam Constitutional:      General: She is not in acute distress.    Appearance: Normal appearance. She is not toxic-appearing or diaphoretic.  HENT:     Head: Normocephalic and atraumatic.     Right Ear: Tympanic membrane and ear canal normal.     Left Ear: Tympanic membrane and ear canal normal.     Nose: Congestion present.     Mouth/Throat:     Mouth: Mucous membranes are moist.     Pharynx: No posterior oropharyngeal erythema.  Eyes:     Extraocular Movements: Extraocular movements intact.     Conjunctiva/sclera: Conjunctivae normal.     Pupils: Pupils are equal, round, and reactive to light.  Cardiovascular:     Rate and Rhythm: Normal rate and regular rhythm.     Pulses: Normal pulses.     Heart sounds: Normal heart sounds.  Pulmonary:     Effort: Pulmonary effort is normal. No respiratory distress.     Breath sounds: Normal breath sounds. No stridor. No wheezing, rhonchi or rales.  Abdominal:     General: Abdomen is flat. Bowel sounds are normal.     Palpations: Abdomen is soft.  Musculoskeletal:        General: Normal range of motion.     Cervical back: Normal range of motion.  Skin:    General: Skin is warm and dry.  Neurological:     General: No focal deficit present.     Mental Status: She is alert and oriented to person, place, and time. Mental status is at baseline.  Psychiatric:        Mood and Affect: Mood normal.        Behavior: Behavior normal.      UC Treatments / Results  Labs (all labs ordered are listed, but only abnormal results are displayed) Labs Reviewed  SARS CORONAVIRUS 2 BY RT PCR    EKG   Radiology No results found.  Procedures Procedures (including critical care time)  Medications Ordered in  UC Medications - No data to display  Initial Impression / Assessment and Plan / UC Course  I have reviewed the triage vital signs  and the nursing notes.  Pertinent labs & imaging results that were available during my care of the patient were reviewed by me and considered in my medical decision making (see chart for details).     Patient presents with symptoms likely from a viral upper respiratory infection. Differential includes bacterial pneumonia, sinusitis, allergic rhinitis, covid 19, flu. Do not suspect underlying cardiopulmonary process. Symptoms seem unlikely related to ACS, CHF or COPD exacerbations, pneumonia, pneumothorax. Patient is nontoxic appearing and not in need of emergent medical intervention.  COVID test pending.  Recommended symptom control with over the counter medications.  Patient sent prescriptions.  These medications should be safe as patient denies that she takes any daily medications currently.  Return if symptoms fail to improve in 1-2 weeks or you develop shortness of breath, chest pain, severe headache. Patient states understanding and is agreeable.  Discharged with PCP followup.  Final Clinical Impressions(s) / UC Diagnoses   Final diagnoses:  Viral upper respiratory tract infection with cough     Discharge Instructions      It appears that you have a viral upper respiratory infection that should run its course and self resolve with symptomatic treatment.  Your COVID test is pending.  We will call if it is positive.  You have been prescribed medications to halp alleviate symptoms.  Please follow-up if symptoms persist or worsen.    ED Prescriptions     Medication Sig Dispense Auth. Provider   cetirizine (ZYRTEC) 10 MG tablet Take 1 tablet (10 mg total) by mouth daily. 30 tablet [place], [place] E, [place]   fluticasone [facility]) 50 MCG/ACT nasal spray Place 1 spray into both nostrils daily for 3 days. 16 g Gustavus Bryant, [place]      PDMP not reviewed this  encounter.   Gustavus Bryant, [place] 10/16/21 1323
